# Patient Record
Sex: Male | Born: 1935
Health system: Southern US, Community
[De-identification: ages and names within clinical notes are randomized; demographics above are authoritative.]

## PROBLEM LIST (undated history)

## (undated) DIAGNOSIS — M1990 Unspecified osteoarthritis, unspecified site: Secondary | ICD-10-CM

## (undated) DIAGNOSIS — I219 Acute myocardial infarction, unspecified: Secondary | ICD-10-CM

## (undated) DIAGNOSIS — I2510 Atherosclerotic heart disease of native coronary artery without angina pectoris: Secondary | ICD-10-CM

## (undated) DIAGNOSIS — T8859XA Other complications of anesthesia, initial encounter: Secondary | ICD-10-CM

## (undated) DIAGNOSIS — K219 Gastro-esophageal reflux disease without esophagitis: Secondary | ICD-10-CM

## (undated) DIAGNOSIS — I4891 Unspecified atrial fibrillation: Secondary | ICD-10-CM

## (undated) DIAGNOSIS — I639 Cerebral infarction, unspecified: Secondary | ICD-10-CM

## (undated) DIAGNOSIS — I10 Essential (primary) hypertension: Secondary | ICD-10-CM

## (undated) DIAGNOSIS — I509 Heart failure, unspecified: Secondary | ICD-10-CM

## (undated) DIAGNOSIS — Z9289 Personal history of other medical treatment: Secondary | ICD-10-CM

## (undated) DIAGNOSIS — T4145XA Adverse effect of unspecified anesthetic, initial encounter: Secondary | ICD-10-CM

## (undated) DIAGNOSIS — K922 Gastrointestinal hemorrhage, unspecified: Secondary | ICD-10-CM

## (undated) DIAGNOSIS — Z950 Presence of cardiac pacemaker: Secondary | ICD-10-CM

## (undated) DIAGNOSIS — F0150 Vascular dementia without behavioral disturbance: Secondary | ICD-10-CM

## (undated) HISTORY — PX: CARDIAC SURGERY: SHX584

## (undated) HISTORY — PX: TOTAL SHOULDER ARTHROPLASTY: SHX126

## (undated) HISTORY — PX: CORONARY ARTERY BYPASS GRAFT: SHX141

## (undated) HISTORY — PX: TOTAL HIP ARTHROPLASTY: SHX124

## (undated) HISTORY — PX: INSERT / REPLACE / REMOVE PACEMAKER: SUR710

## (undated) HISTORY — PX: HERNIA REPAIR: SHX51

---

## 1977-09-27 DIAGNOSIS — I219 Acute myocardial infarction, unspecified: Secondary | ICD-10-CM

## 1977-09-27 HISTORY — DX: Acute myocardial infarction, unspecified: I21.9

## 2014-10-14 DIAGNOSIS — M79675 Pain in left toe(s): Secondary | ICD-10-CM | POA: Diagnosis not present

## 2014-10-14 DIAGNOSIS — B351 Tinea unguium: Secondary | ICD-10-CM | POA: Diagnosis not present

## 2014-10-14 DIAGNOSIS — M79674 Pain in right toe(s): Secondary | ICD-10-CM | POA: Diagnosis not present

## 2014-10-21 DIAGNOSIS — I2510 Atherosclerotic heart disease of native coronary artery without angina pectoris: Secondary | ICD-10-CM | POA: Diagnosis not present

## 2014-10-21 DIAGNOSIS — I739 Peripheral vascular disease, unspecified: Secondary | ICD-10-CM | POA: Diagnosis not present

## 2014-10-21 DIAGNOSIS — I4891 Unspecified atrial fibrillation: Secondary | ICD-10-CM | POA: Diagnosis not present

## 2014-10-21 DIAGNOSIS — E785 Hyperlipidemia, unspecified: Secondary | ICD-10-CM | POA: Diagnosis not present

## 2014-10-21 DIAGNOSIS — I10 Essential (primary) hypertension: Secondary | ICD-10-CM | POA: Diagnosis not present

## 2014-10-21 DIAGNOSIS — Z9181 History of falling: Secondary | ICD-10-CM | POA: Diagnosis not present

## 2014-10-21 DIAGNOSIS — I209 Angina pectoris, unspecified: Secondary | ICD-10-CM | POA: Diagnosis not present

## 2014-11-14 DIAGNOSIS — J90 Pleural effusion, not elsewhere classified: Secondary | ICD-10-CM | POA: Diagnosis not present

## 2014-11-14 DIAGNOSIS — R079 Chest pain, unspecified: Secondary | ICD-10-CM | POA: Diagnosis not present

## 2014-11-14 DIAGNOSIS — I10 Essential (primary) hypertension: Secondary | ICD-10-CM | POA: Diagnosis not present

## 2014-11-14 DIAGNOSIS — I2510 Atherosclerotic heart disease of native coronary artery without angina pectoris: Secondary | ICD-10-CM | POA: Diagnosis not present

## 2014-11-14 DIAGNOSIS — I517 Cardiomegaly: Secondary | ICD-10-CM | POA: Diagnosis not present

## 2014-11-14 DIAGNOSIS — I4891 Unspecified atrial fibrillation: Secondary | ICD-10-CM | POA: Diagnosis not present

## 2014-11-14 DIAGNOSIS — R0602 Shortness of breath: Secondary | ICD-10-CM | POA: Diagnosis not present

## 2014-11-14 DIAGNOSIS — K219 Gastro-esophageal reflux disease without esophagitis: Secondary | ICD-10-CM | POA: Diagnosis not present

## 2014-11-26 DIAGNOSIS — I10 Essential (primary) hypertension: Secondary | ICD-10-CM | POA: Diagnosis not present

## 2014-11-26 DIAGNOSIS — I2510 Atherosclerotic heart disease of native coronary artery without angina pectoris: Secondary | ICD-10-CM | POA: Diagnosis not present

## 2014-11-26 DIAGNOSIS — E785 Hyperlipidemia, unspecified: Secondary | ICD-10-CM | POA: Diagnosis not present

## 2014-11-26 DIAGNOSIS — I4891 Unspecified atrial fibrillation: Secondary | ICD-10-CM | POA: Diagnosis not present

## 2014-11-27 DIAGNOSIS — R4182 Altered mental status, unspecified: Secondary | ICD-10-CM | POA: Diagnosis not present

## 2014-11-27 DIAGNOSIS — S0990XA Unspecified injury of head, initial encounter: Secondary | ICD-10-CM | POA: Diagnosis not present

## 2014-11-27 DIAGNOSIS — I6782 Cerebral ischemia: Secondary | ICD-10-CM | POA: Diagnosis not present

## 2014-12-10 ENCOUNTER — Encounter: Payer: Self-pay | Admitting: Cardiology

## 2014-12-10 DIAGNOSIS — I495 Sick sinus syndrome: Secondary | ICD-10-CM | POA: Diagnosis not present

## 2015-01-22 DIAGNOSIS — R2681 Unsteadiness on feet: Secondary | ICD-10-CM | POA: Diagnosis not present

## 2015-01-22 DIAGNOSIS — R296 Repeated falls: Secondary | ICD-10-CM | POA: Diagnosis not present

## 2015-01-22 DIAGNOSIS — G40209 Localization-related (focal) (partial) symptomatic epilepsy and epileptic syndromes with complex partial seizures, not intractable, without status epilepticus: Secondary | ICD-10-CM | POA: Diagnosis not present

## 2015-01-22 DIAGNOSIS — R413 Other amnesia: Secondary | ICD-10-CM | POA: Diagnosis not present

## 2015-01-27 DIAGNOSIS — M79674 Pain in right toe(s): Secondary | ICD-10-CM | POA: Diagnosis not present

## 2015-01-27 DIAGNOSIS — B351 Tinea unguium: Secondary | ICD-10-CM | POA: Diagnosis not present

## 2015-01-27 DIAGNOSIS — M79675 Pain in left toe(s): Secondary | ICD-10-CM | POA: Diagnosis not present

## 2015-01-29 DIAGNOSIS — I2510 Atherosclerotic heart disease of native coronary artery without angina pectoris: Secondary | ICD-10-CM | POA: Diagnosis not present

## 2015-01-29 DIAGNOSIS — E785 Hyperlipidemia, unspecified: Secondary | ICD-10-CM | POA: Diagnosis not present

## 2015-01-29 DIAGNOSIS — I209 Angina pectoris, unspecified: Secondary | ICD-10-CM | POA: Diagnosis not present

## 2015-01-29 DIAGNOSIS — R296 Repeated falls: Secondary | ICD-10-CM | POA: Diagnosis not present

## 2015-01-29 DIAGNOSIS — I739 Peripheral vascular disease, unspecified: Secondary | ICD-10-CM | POA: Diagnosis not present

## 2015-01-29 DIAGNOSIS — I4891 Unspecified atrial fibrillation: Secondary | ICD-10-CM | POA: Diagnosis not present

## 2015-02-03 DIAGNOSIS — G40209 Localization-related (focal) (partial) symptomatic epilepsy and epileptic syndromes with complex partial seizures, not intractable, without status epilepticus: Secondary | ICD-10-CM | POA: Diagnosis not present

## 2015-02-04 DIAGNOSIS — R413 Other amnesia: Secondary | ICD-10-CM | POA: Diagnosis not present

## 2015-02-04 DIAGNOSIS — R410 Disorientation, unspecified: Secondary | ICD-10-CM | POA: Diagnosis not present

## 2015-02-05 DIAGNOSIS — R531 Weakness: Secondary | ICD-10-CM | POA: Diagnosis not present

## 2015-02-05 DIAGNOSIS — R9082 White matter disease, unspecified: Secondary | ICD-10-CM | POA: Diagnosis not present

## 2015-02-05 DIAGNOSIS — R296 Repeated falls: Secondary | ICD-10-CM | POA: Diagnosis not present

## 2015-02-05 DIAGNOSIS — S0990XA Unspecified injury of head, initial encounter: Secondary | ICD-10-CM | POA: Diagnosis not present

## 2015-02-05 DIAGNOSIS — R413 Other amnesia: Secondary | ICD-10-CM | POA: Diagnosis not present

## 2015-02-05 DIAGNOSIS — G319 Degenerative disease of nervous system, unspecified: Secondary | ICD-10-CM | POA: Diagnosis not present

## 2015-02-10 DIAGNOSIS — I4891 Unspecified atrial fibrillation: Secondary | ICD-10-CM | POA: Diagnosis not present

## 2015-02-10 DIAGNOSIS — F0150 Vascular dementia without behavioral disturbance: Secondary | ICD-10-CM | POA: Diagnosis not present

## 2015-02-10 DIAGNOSIS — E784 Other hyperlipidemia: Secondary | ICD-10-CM | POA: Diagnosis not present

## 2015-02-10 DIAGNOSIS — I2510 Atherosclerotic heart disease of native coronary artery without angina pectoris: Secondary | ICD-10-CM | POA: Diagnosis not present

## 2015-02-14 DIAGNOSIS — S8001XA Contusion of right knee, initial encounter: Secondary | ICD-10-CM | POA: Diagnosis not present

## 2015-02-14 DIAGNOSIS — S0181XA Laceration without foreign body of other part of head, initial encounter: Secondary | ICD-10-CM | POA: Diagnosis not present

## 2015-02-14 DIAGNOSIS — J0120 Acute ethmoidal sinusitis, unspecified: Secondary | ICD-10-CM | POA: Diagnosis not present

## 2015-02-14 DIAGNOSIS — M25561 Pain in right knee: Secondary | ICD-10-CM | POA: Diagnosis not present

## 2015-02-14 DIAGNOSIS — J0100 Acute maxillary sinusitis, unspecified: Secondary | ICD-10-CM | POA: Diagnosis not present

## 2015-02-14 DIAGNOSIS — S0990XA Unspecified injury of head, initial encounter: Secondary | ICD-10-CM | POA: Diagnosis not present

## 2015-02-14 DIAGNOSIS — S0993XA Unspecified injury of face, initial encounter: Secondary | ICD-10-CM | POA: Diagnosis not present

## 2015-02-14 DIAGNOSIS — H05231 Hemorrhage of right orbit: Secondary | ICD-10-CM | POA: Diagnosis not present

## 2015-02-14 DIAGNOSIS — T148 Other injury of unspecified body region: Secondary | ICD-10-CM | POA: Diagnosis not present

## 2015-02-14 DIAGNOSIS — Z87891 Personal history of nicotine dependence: Secondary | ICD-10-CM | POA: Diagnosis not present

## 2015-02-14 DIAGNOSIS — Z79899 Other long term (current) drug therapy: Secondary | ICD-10-CM | POA: Diagnosis not present

## 2015-02-14 DIAGNOSIS — S0190XA Unspecified open wound of unspecified part of head, initial encounter: Secondary | ICD-10-CM | POA: Diagnosis not present

## 2015-02-14 DIAGNOSIS — W1830XA Fall on same level, unspecified, initial encounter: Secondary | ICD-10-CM | POA: Diagnosis not present

## 2015-02-14 DIAGNOSIS — Y999 Unspecified external cause status: Secondary | ICD-10-CM | POA: Diagnosis not present

## 2015-02-14 DIAGNOSIS — G501 Atypical facial pain: Secondary | ICD-10-CM | POA: Diagnosis not present

## 2015-02-18 DIAGNOSIS — S01111A Laceration without foreign body of right eyelid and periocular area, initial encounter: Secondary | ICD-10-CM | POA: Diagnosis not present

## 2015-03-04 DIAGNOSIS — H2513 Age-related nuclear cataract, bilateral: Secondary | ICD-10-CM | POA: Diagnosis not present

## 2015-03-10 DIAGNOSIS — I10 Essential (primary) hypertension: Secondary | ICD-10-CM | POA: Diagnosis not present

## 2015-03-10 DIAGNOSIS — M1990 Unspecified osteoarthritis, unspecified site: Secondary | ICD-10-CM | POA: Diagnosis not present

## 2015-03-10 DIAGNOSIS — I2510 Atherosclerotic heart disease of native coronary artery without angina pectoris: Secondary | ICD-10-CM | POA: Diagnosis not present

## 2015-03-10 DIAGNOSIS — M5136 Other intervertebral disc degeneration, lumbar region: Secondary | ICD-10-CM | POA: Diagnosis not present

## 2015-03-10 DIAGNOSIS — K219 Gastro-esophageal reflux disease without esophagitis: Secondary | ICD-10-CM | POA: Diagnosis not present

## 2015-03-10 DIAGNOSIS — I4891 Unspecified atrial fibrillation: Secondary | ICD-10-CM | POA: Diagnosis not present

## 2015-03-10 DIAGNOSIS — M5137 Other intervertebral disc degeneration, lumbosacral region: Secondary | ICD-10-CM | POA: Diagnosis not present

## 2015-04-08 ENCOUNTER — Encounter: Payer: Self-pay | Admitting: Cardiology

## 2015-04-08 DIAGNOSIS — E785 Hyperlipidemia, unspecified: Secondary | ICD-10-CM | POA: Diagnosis not present

## 2015-04-08 DIAGNOSIS — I2510 Atherosclerotic heart disease of native coronary artery without angina pectoris: Secondary | ICD-10-CM | POA: Diagnosis not present

## 2015-04-08 DIAGNOSIS — R413 Other amnesia: Secondary | ICD-10-CM | POA: Diagnosis not present

## 2015-04-08 DIAGNOSIS — I4891 Unspecified atrial fibrillation: Secondary | ICD-10-CM | POA: Diagnosis not present

## 2015-04-08 DIAGNOSIS — G40209 Localization-related (focal) (partial) symptomatic epilepsy and epileptic syndromes with complex partial seizures, not intractable, without status epilepticus: Secondary | ICD-10-CM | POA: Diagnosis not present

## 2015-04-08 DIAGNOSIS — R2681 Unsteadiness on feet: Secondary | ICD-10-CM | POA: Diagnosis not present

## 2015-04-08 DIAGNOSIS — I10 Essential (primary) hypertension: Secondary | ICD-10-CM | POA: Diagnosis not present

## 2015-04-08 DIAGNOSIS — F0150 Vascular dementia without behavioral disturbance: Secondary | ICD-10-CM | POA: Diagnosis not present

## 2015-04-16 ENCOUNTER — Encounter: Payer: Self-pay | Admitting: Cardiology

## 2015-04-16 DIAGNOSIS — I495 Sick sinus syndrome: Secondary | ICD-10-CM | POA: Diagnosis not present

## 2015-04-28 DIAGNOSIS — Z7901 Long term (current) use of anticoagulants: Secondary | ICD-10-CM | POA: Diagnosis not present

## 2015-04-28 DIAGNOSIS — B351 Tinea unguium: Secondary | ICD-10-CM | POA: Diagnosis not present

## 2015-04-28 DIAGNOSIS — M79675 Pain in left toe(s): Secondary | ICD-10-CM | POA: Diagnosis not present

## 2015-04-28 DIAGNOSIS — M79674 Pain in right toe(s): Secondary | ICD-10-CM | POA: Diagnosis not present

## 2015-05-15 DIAGNOSIS — R51 Headache: Secondary | ICD-10-CM | POA: Diagnosis not present

## 2015-05-15 DIAGNOSIS — Z87891 Personal history of nicotine dependence: Secondary | ICD-10-CM | POA: Diagnosis not present

## 2015-05-15 DIAGNOSIS — R109 Unspecified abdominal pain: Secondary | ICD-10-CM | POA: Diagnosis not present

## 2015-05-15 DIAGNOSIS — R110 Nausea: Secondary | ICD-10-CM | POA: Diagnosis not present

## 2015-05-15 DIAGNOSIS — R42 Dizziness and giddiness: Secondary | ICD-10-CM | POA: Diagnosis not present

## 2015-05-15 DIAGNOSIS — I25810 Atherosclerosis of coronary artery bypass graft(s) without angina pectoris: Secondary | ICD-10-CM | POA: Diagnosis not present

## 2015-05-15 DIAGNOSIS — I10 Essential (primary) hypertension: Secondary | ICD-10-CM | POA: Diagnosis not present

## 2015-05-15 DIAGNOSIS — R4781 Slurred speech: Secondary | ICD-10-CM | POA: Diagnosis not present

## 2015-05-19 DIAGNOSIS — Z0000 Encounter for general adult medical examination without abnormal findings: Secondary | ICD-10-CM | POA: Diagnosis not present

## 2015-05-19 DIAGNOSIS — R413 Other amnesia: Secondary | ICD-10-CM | POA: Diagnosis not present

## 2015-05-19 DIAGNOSIS — I4891 Unspecified atrial fibrillation: Secondary | ICD-10-CM | POA: Diagnosis not present

## 2015-05-19 DIAGNOSIS — I10 Essential (primary) hypertension: Secondary | ICD-10-CM | POA: Diagnosis not present

## 2015-05-19 DIAGNOSIS — J018 Other acute sinusitis: Secondary | ICD-10-CM | POA: Diagnosis not present

## 2015-06-17 DIAGNOSIS — F0150 Vascular dementia without behavioral disturbance: Secondary | ICD-10-CM | POA: Diagnosis not present

## 2015-06-17 DIAGNOSIS — I2510 Atherosclerotic heart disease of native coronary artery without angina pectoris: Secondary | ICD-10-CM | POA: Diagnosis not present

## 2015-06-17 DIAGNOSIS — I4891 Unspecified atrial fibrillation: Secondary | ICD-10-CM | POA: Diagnosis not present

## 2015-06-17 DIAGNOSIS — I10 Essential (primary) hypertension: Secondary | ICD-10-CM | POA: Diagnosis not present

## 2015-06-25 DIAGNOSIS — Z23 Encounter for immunization: Secondary | ICD-10-CM | POA: Diagnosis not present

## 2015-07-29 DIAGNOSIS — Z7901 Long term (current) use of anticoagulants: Secondary | ICD-10-CM | POA: Diagnosis not present

## 2015-07-29 DIAGNOSIS — M79675 Pain in left toe(s): Secondary | ICD-10-CM | POA: Diagnosis not present

## 2015-07-29 DIAGNOSIS — B351 Tinea unguium: Secondary | ICD-10-CM | POA: Diagnosis not present

## 2015-07-29 DIAGNOSIS — M79674 Pain in right toe(s): Secondary | ICD-10-CM | POA: Diagnosis not present

## 2015-08-12 DIAGNOSIS — E785 Hyperlipidemia, unspecified: Secondary | ICD-10-CM | POA: Diagnosis not present

## 2015-08-12 DIAGNOSIS — I4891 Unspecified atrial fibrillation: Secondary | ICD-10-CM | POA: Diagnosis not present

## 2015-08-12 DIAGNOSIS — I209 Angina pectoris, unspecified: Secondary | ICD-10-CM | POA: Diagnosis not present

## 2015-08-12 DIAGNOSIS — I2510 Atherosclerotic heart disease of native coronary artery without angina pectoris: Secondary | ICD-10-CM | POA: Diagnosis not present

## 2015-08-12 DIAGNOSIS — R296 Repeated falls: Secondary | ICD-10-CM | POA: Diagnosis not present

## 2015-08-12 DIAGNOSIS — Z950 Presence of cardiac pacemaker: Secondary | ICD-10-CM | POA: Diagnosis not present

## 2015-08-12 DIAGNOSIS — F0390 Unspecified dementia without behavioral disturbance: Secondary | ICD-10-CM | POA: Diagnosis not present

## 2015-08-12 DIAGNOSIS — I739 Peripheral vascular disease, unspecified: Secondary | ICD-10-CM | POA: Diagnosis not present

## 2015-09-01 ENCOUNTER — Encounter: Payer: Self-pay | Admitting: Cardiology

## 2015-09-01 DIAGNOSIS — I495 Sick sinus syndrome: Secondary | ICD-10-CM | POA: Diagnosis not present

## 2015-09-02 DIAGNOSIS — Z111 Encounter for screening for respiratory tuberculosis: Secondary | ICD-10-CM | POA: Diagnosis not present

## 2015-09-02 DIAGNOSIS — I4891 Unspecified atrial fibrillation: Secondary | ICD-10-CM | POA: Diagnosis not present

## 2015-09-02 DIAGNOSIS — K219 Gastro-esophageal reflux disease without esophagitis: Secondary | ICD-10-CM | POA: Diagnosis not present

## 2015-09-02 DIAGNOSIS — I2510 Atherosclerotic heart disease of native coronary artery without angina pectoris: Secondary | ICD-10-CM | POA: Diagnosis not present

## 2015-09-02 DIAGNOSIS — I10 Essential (primary) hypertension: Secondary | ICD-10-CM | POA: Diagnosis not present

## 2015-10-06 DIAGNOSIS — Z111 Encounter for screening for respiratory tuberculosis: Secondary | ICD-10-CM | POA: Diagnosis not present

## 2015-10-06 DIAGNOSIS — Z23 Encounter for immunization: Secondary | ICD-10-CM | POA: Diagnosis not present

## 2015-10-09 DIAGNOSIS — Z79899 Other long term (current) drug therapy: Secondary | ICD-10-CM | POA: Diagnosis not present

## 2015-10-09 DIAGNOSIS — I2510 Atherosclerotic heart disease of native coronary artery without angina pectoris: Secondary | ICD-10-CM | POA: Diagnosis not present

## 2015-10-09 DIAGNOSIS — I10 Essential (primary) hypertension: Secondary | ICD-10-CM | POA: Diagnosis not present

## 2015-10-09 DIAGNOSIS — R0789 Other chest pain: Secondary | ICD-10-CM | POA: Diagnosis not present

## 2015-11-18 ENCOUNTER — Encounter: Payer: Self-pay | Admitting: Cardiology

## 2015-11-18 DIAGNOSIS — I495 Sick sinus syndrome: Secondary | ICD-10-CM | POA: Diagnosis not present

## 2015-11-20 DIAGNOSIS — I10 Essential (primary) hypertension: Secondary | ICD-10-CM | POA: Diagnosis not present

## 2015-11-20 DIAGNOSIS — R0989 Other specified symptoms and signs involving the circulatory and respiratory systems: Secondary | ICD-10-CM | POA: Diagnosis not present

## 2015-11-20 DIAGNOSIS — I4891 Unspecified atrial fibrillation: Secondary | ICD-10-CM | POA: Diagnosis not present

## 2015-11-20 DIAGNOSIS — R0602 Shortness of breath: Secondary | ICD-10-CM | POA: Diagnosis not present

## 2015-11-20 DIAGNOSIS — I517 Cardiomegaly: Secondary | ICD-10-CM | POA: Diagnosis not present

## 2015-11-20 DIAGNOSIS — J90 Pleural effusion, not elsewhere classified: Secondary | ICD-10-CM | POA: Diagnosis not present

## 2015-11-27 DIAGNOSIS — I10 Essential (primary) hypertension: Secondary | ICD-10-CM | POA: Diagnosis not present

## 2015-11-27 DIAGNOSIS — J90 Pleural effusion, not elsewhere classified: Secondary | ICD-10-CM | POA: Diagnosis not present

## 2015-11-27 DIAGNOSIS — I517 Cardiomegaly: Secondary | ICD-10-CM | POA: Diagnosis not present

## 2015-11-27 DIAGNOSIS — R0602 Shortness of breath: Secondary | ICD-10-CM | POA: Diagnosis not present

## 2015-11-27 DIAGNOSIS — Z950 Presence of cardiac pacemaker: Secondary | ICD-10-CM | POA: Diagnosis not present

## 2015-12-03 DIAGNOSIS — I4891 Unspecified atrial fibrillation: Secondary | ICD-10-CM | POA: Diagnosis not present

## 2015-12-03 DIAGNOSIS — I10 Essential (primary) hypertension: Secondary | ICD-10-CM | POA: Diagnosis not present

## 2015-12-03 DIAGNOSIS — I509 Heart failure, unspecified: Secondary | ICD-10-CM | POA: Diagnosis not present

## 2015-12-03 DIAGNOSIS — R0602 Shortness of breath: Secondary | ICD-10-CM | POA: Diagnosis not present

## 2015-12-23 DIAGNOSIS — R399 Unspecified symptoms and signs involving the genitourinary system: Secondary | ICD-10-CM | POA: Diagnosis not present

## 2015-12-31 DIAGNOSIS — I509 Heart failure, unspecified: Secondary | ICD-10-CM | POA: Diagnosis not present

## 2015-12-31 DIAGNOSIS — F0390 Unspecified dementia without behavioral disturbance: Secondary | ICD-10-CM | POA: Diagnosis not present

## 2015-12-31 DIAGNOSIS — I10 Essential (primary) hypertension: Secondary | ICD-10-CM | POA: Diagnosis not present

## 2015-12-31 DIAGNOSIS — I4891 Unspecified atrial fibrillation: Secondary | ICD-10-CM | POA: Diagnosis not present

## 2016-01-26 DIAGNOSIS — K922 Gastrointestinal hemorrhage, unspecified: Secondary | ICD-10-CM

## 2016-01-26 HISTORY — DX: Gastrointestinal hemorrhage, unspecified: K92.2

## 2016-01-29 ENCOUNTER — Emergency Department (HOSPITAL_COMMUNITY): Payer: Medicare Other

## 2016-01-29 ENCOUNTER — Encounter (HOSPITAL_COMMUNITY): Payer: Self-pay | Admitting: Emergency Medicine

## 2016-01-29 ENCOUNTER — Inpatient Hospital Stay (HOSPITAL_COMMUNITY)
Admission: EM | Admit: 2016-01-29 | Discharge: 2016-01-30 | DRG: 378 | Disposition: A | Discharge status: Home Health | Payer: Medicare Other | Attending: Internal Medicine | Admitting: Internal Medicine

## 2016-01-29 DIAGNOSIS — Z951 Presence of aortocoronary bypass graft: Secondary | ICD-10-CM | POA: Diagnosis not present

## 2016-01-29 DIAGNOSIS — F0150 Vascular dementia without behavioral disturbance: Secondary | ICD-10-CM | POA: Diagnosis present

## 2016-01-29 DIAGNOSIS — N179 Acute kidney failure, unspecified: Secondary | ICD-10-CM

## 2016-01-29 DIAGNOSIS — Z515 Encounter for palliative care: Secondary | ICD-10-CM | POA: Diagnosis present

## 2016-01-29 DIAGNOSIS — T82837A Hemorrhage of cardiac prosthetic devices, implants and grafts, initial encounter: Secondary | ICD-10-CM | POA: Diagnosis not present

## 2016-01-29 DIAGNOSIS — Z66 Do not resuscitate: Secondary | ICD-10-CM | POA: Diagnosis present

## 2016-01-29 DIAGNOSIS — I25118 Atherosclerotic heart disease of native coronary artery with other forms of angina pectoris: Secondary | ICD-10-CM | POA: Diagnosis present

## 2016-01-29 DIAGNOSIS — R0600 Dyspnea, unspecified: Secondary | ICD-10-CM

## 2016-01-29 DIAGNOSIS — Z7901 Long term (current) use of anticoagulants: Secondary | ICD-10-CM

## 2016-01-29 DIAGNOSIS — I4891 Unspecified atrial fibrillation: Secondary | ICD-10-CM | POA: Diagnosis present

## 2016-01-29 DIAGNOSIS — I959 Hypotension, unspecified: Secondary | ICD-10-CM | POA: Diagnosis present

## 2016-01-29 DIAGNOSIS — I509 Heart failure, unspecified: Secondary | ICD-10-CM | POA: Diagnosis present

## 2016-01-29 DIAGNOSIS — K921 Melena: Principal | ICD-10-CM | POA: Diagnosis present

## 2016-01-29 DIAGNOSIS — Z96642 Presence of left artificial hip joint: Secondary | ICD-10-CM | POA: Diagnosis present

## 2016-01-29 DIAGNOSIS — K219 Gastro-esophageal reflux disease without esophagitis: Secondary | ICD-10-CM | POA: Diagnosis present

## 2016-01-29 DIAGNOSIS — D500 Iron deficiency anemia secondary to blood loss (chronic): Secondary | ICD-10-CM | POA: Diagnosis present

## 2016-01-29 DIAGNOSIS — K922 Gastrointestinal hemorrhage, unspecified: Secondary | ICD-10-CM | POA: Diagnosis not present

## 2016-01-29 DIAGNOSIS — R0902 Hypoxemia: Secondary | ICD-10-CM | POA: Diagnosis present

## 2016-01-29 DIAGNOSIS — E872 Acidosis: Secondary | ICD-10-CM | POA: Diagnosis present

## 2016-01-29 DIAGNOSIS — I10 Essential (primary) hypertension: Secondary | ICD-10-CM

## 2016-01-29 DIAGNOSIS — R0602 Shortness of breath: Secondary | ICD-10-CM | POA: Diagnosis not present

## 2016-01-29 DIAGNOSIS — M542 Cervicalgia: Secondary | ICD-10-CM | POA: Diagnosis not present

## 2016-01-29 DIAGNOSIS — R51 Headache: Secondary | ICD-10-CM | POA: Diagnosis not present

## 2016-01-29 DIAGNOSIS — I2510 Atherosclerotic heart disease of native coronary artery without angina pectoris: Secondary | ICD-10-CM | POA: Insufficient documentation

## 2016-01-29 DIAGNOSIS — I248 Other forms of acute ischemic heart disease: Secondary | ICD-10-CM | POA: Diagnosis present

## 2016-01-29 DIAGNOSIS — Z950 Presence of cardiac pacemaker: Secondary | ICD-10-CM | POA: Diagnosis not present

## 2016-01-29 DIAGNOSIS — S0990XA Unspecified injury of head, initial encounter: Secondary | ICD-10-CM | POA: Diagnosis not present

## 2016-01-29 DIAGNOSIS — T148XXA Other injury of unspecified body region, initial encounter: Secondary | ICD-10-CM | POA: Insufficient documentation

## 2016-01-29 DIAGNOSIS — W19XXXA Unspecified fall, initial encounter: Secondary | ICD-10-CM

## 2016-01-29 DIAGNOSIS — Z87891 Personal history of nicotine dependence: Secondary | ICD-10-CM

## 2016-01-29 DIAGNOSIS — I110 Hypertensive heart disease with heart failure: Secondary | ICD-10-CM | POA: Diagnosis present

## 2016-01-29 DIAGNOSIS — S199XXA Unspecified injury of neck, initial encounter: Secondary | ICD-10-CM | POA: Diagnosis not present

## 2016-01-29 DIAGNOSIS — M546 Pain in thoracic spine: Secondary | ICD-10-CM | POA: Diagnosis not present

## 2016-01-29 DIAGNOSIS — T148 Other injury of unspecified body region: Secondary | ICD-10-CM | POA: Diagnosis not present

## 2016-01-29 DIAGNOSIS — D649 Anemia, unspecified: Secondary | ICD-10-CM

## 2016-01-29 DIAGNOSIS — D689 Coagulation defect, unspecified: Secondary | ICD-10-CM | POA: Diagnosis present

## 2016-01-29 DIAGNOSIS — E8729 Other acidosis: Secondary | ICD-10-CM

## 2016-01-29 HISTORY — DX: Acute myocardial infarction, unspecified: I21.9

## 2016-01-29 HISTORY — DX: Presence of cardiac pacemaker: Z95.0

## 2016-01-29 HISTORY — DX: Essential (primary) hypertension: I10

## 2016-01-29 HISTORY — DX: Gastro-esophageal reflux disease without esophagitis: K21.9

## 2016-01-29 HISTORY — DX: Heart failure, unspecified: I50.9

## 2016-01-29 HISTORY — DX: Atherosclerotic heart disease of native coronary artery without angina pectoris: I25.10

## 2016-01-29 HISTORY — DX: Other complications of anesthesia, initial encounter: T88.59XA

## 2016-01-29 HISTORY — DX: Adverse effect of unspecified anesthetic, initial encounter: T41.45XA

## 2016-01-29 HISTORY — DX: Unspecified osteoarthritis, unspecified site: M19.90

## 2016-01-29 HISTORY — DX: Unspecified atrial fibrillation: I48.91

## 2016-01-29 HISTORY — DX: Gastrointestinal hemorrhage, unspecified: K92.2

## 2016-01-29 HISTORY — DX: Vascular dementia, unspecified severity, without behavioral disturbance, psychotic disturbance, mood disturbance, and anxiety: F01.50

## 2016-01-29 LAB — CBC
HCT: 22 % — ABNORMAL LOW (ref 39.0–52.0)
HEMOGLOBIN: 7 g/dL — AB (ref 13.0–17.0)
MCH: 26.6 pg (ref 26.0–34.0)
MCHC: 31.8 g/dL (ref 30.0–36.0)
MCV: 83.7 fL (ref 78.0–100.0)
PLATELETS: 240 10*3/uL (ref 150–400)
RBC: 2.63 MIL/uL — AB (ref 4.22–5.81)
RDW: 15.7 % — ABNORMAL HIGH (ref 11.5–15.5)
WBC: 12.6 10*3/uL — AB (ref 4.0–10.5)

## 2016-01-29 LAB — CBC WITH DIFFERENTIAL/PLATELET
Basophils Absolute: 0 10*3/uL (ref 0.0–0.1)
Basophils Relative: 0 %
EOS ABS: 0 10*3/uL (ref 0.0–0.7)
EOS PCT: 0 %
HCT: 15.1 % — ABNORMAL LOW (ref 39.0–52.0)
Hemoglobin: 4.6 g/dL — CL (ref 13.0–17.0)
LYMPHS ABS: 1 10*3/uL (ref 0.7–4.0)
Lymphocytes Relative: 8 %
MCH: 25.3 pg — AB (ref 26.0–34.0)
MCHC: 30.5 g/dL (ref 30.0–36.0)
MCV: 83 fL (ref 78.0–100.0)
MONO ABS: 0.7 10*3/uL (ref 0.1–1.0)
MONOS PCT: 6 %
Neutro Abs: 10 10*3/uL — ABNORMAL HIGH (ref 1.7–7.7)
Neutrophils Relative %: 85 %
Platelets: 266 10*3/uL (ref 150–400)
RBC: 1.82 MIL/uL — AB (ref 4.22–5.81)
RDW: 16.6 % — AB (ref 11.5–15.5)
WBC: 11.8 10*3/uL — AB (ref 4.0–10.5)

## 2016-01-29 LAB — I-STAT CHEM 8, ED
BUN: 43 mg/dL — AB (ref 6–20)
CREATININE: 2 mg/dL — AB (ref 0.61–1.24)
Calcium, Ion: 1.12 mmol/L — ABNORMAL LOW (ref 1.13–1.30)
Chloride: 98 mmol/L — ABNORMAL LOW (ref 101–111)
Glucose, Bld: 107 mg/dL — ABNORMAL HIGH (ref 65–99)
HCT: 16 % — ABNORMAL LOW (ref 39.0–52.0)
HEMOGLOBIN: 5.4 g/dL — AB (ref 13.0–17.0)
POTASSIUM: 3.6 mmol/L (ref 3.5–5.1)
Sodium: 135 mmol/L (ref 135–145)
TCO2: 23 mmol/L (ref 0–100)

## 2016-01-29 LAB — COMPREHENSIVE METABOLIC PANEL
ALBUMIN: 2.5 g/dL — AB (ref 3.5–5.0)
ALT: 18 U/L (ref 17–63)
AST: 32 U/L (ref 15–41)
Alkaline Phosphatase: 41 U/L (ref 38–126)
Anion gap: 11 (ref 5–15)
BUN: 43 mg/dL — AB (ref 6–20)
CHLORIDE: 100 mmol/L — AB (ref 101–111)
CO2: 23 mmol/L (ref 22–32)
CREATININE: 1.95 mg/dL — AB (ref 0.61–1.24)
Calcium: 8 mg/dL — ABNORMAL LOW (ref 8.9–10.3)
GFR calc Af Amer: 35 mL/min — ABNORMAL LOW (ref >60)
GFR, EST NON AFRICAN AMERICAN: 30 mL/min — AB (ref >60)
GLUCOSE: 110 mg/dL — AB (ref 65–99)
Potassium: 3.7 mmol/L (ref 3.5–5.1)
Sodium: 134 mmol/L — ABNORMAL LOW (ref 135–145)
Total Bilirubin: 0.5 mg/dL (ref 0.3–1.2)
Total Protein: 5.8 g/dL — ABNORMAL LOW (ref 6.5–8.1)

## 2016-01-29 LAB — AMMONIA: AMMONIA: 19 umol/L (ref 9–35)

## 2016-01-29 LAB — PREPARE RBC (CROSSMATCH)

## 2016-01-29 LAB — I-STAT CG4 LACTIC ACID, ED: Lactic Acid, Venous: 1.81 mmol/L (ref 0.5–2.0)

## 2016-01-29 LAB — ABO/RH: ABO/RH(D): A POS

## 2016-01-29 LAB — PROTIME-INR
INR: 4.05 — AB (ref 0.00–1.49)
PROTHROMBIN TIME: 38.4 s — AB (ref 11.6–15.2)

## 2016-01-29 LAB — POC OCCULT BLOOD, ED: FECAL OCCULT BLD: POSITIVE — AB

## 2016-01-29 LAB — MAGNESIUM: MAGNESIUM: 2.2 mg/dL (ref 1.7–2.4)

## 2016-01-29 MED ORDER — ONDANSETRON HCL 4 MG/2ML IJ SOLN
4.0000 mg | Freq: Once | INTRAMUSCULAR | Status: AC
  Administered 2016-01-29: 4 mg via INTRAVENOUS
  Filled 2016-01-29: qty 2

## 2016-01-29 MED ORDER — SODIUM CHLORIDE 0.9% FLUSH
3.0000 mL | Freq: Two times a day (BID) | INTRAVENOUS | Status: DC
  Administered 2016-01-29 – 2016-01-30 (×2): 3 mL via INTRAVENOUS

## 2016-01-29 MED ORDER — ACETAMINOPHEN 325 MG PO TABS: 650.0000 mg | ORAL_TABLET | Freq: Four times a day (QID) | ORAL | Status: DC | PRN

## 2016-01-29 MED ORDER — NITROGLYCERIN 0.4 MG SL SUBL: 0.4000 mg | SUBLINGUAL_TABLET | SUBLINGUAL | Status: DC | PRN

## 2016-01-29 MED ORDER — NEBIVOLOL HCL 10 MG PO TABS
10.0000 mg | ORAL_TABLET | Freq: Every day | ORAL | Status: DC
  Filled 2016-01-29: qty 1

## 2016-01-29 MED ORDER — ISOSORBIDE MONONITRATE ER 60 MG PO TB24
120.0000 mg | ORAL_TABLET | Freq: Every day | ORAL | Status: DC
  Administered 2016-01-30: 120 mg via ORAL
  Filled 2016-01-29: qty 2

## 2016-01-29 MED ORDER — POTASSIUM CHLORIDE CRYS ER 20 MEQ PO TBCR
40.0000 meq | EXTENDED_RELEASE_TABLET | Freq: Once | ORAL | Status: AC
  Administered 2016-01-29: 40 meq via ORAL
  Filled 2016-01-29: qty 2

## 2016-01-29 MED ORDER — ACETAMINOPHEN 650 MG RE SUPP: 650.0000 mg | Freq: Four times a day (QID) | RECTAL | Status: DC | PRN

## 2016-01-29 MED ORDER — ROSUVASTATIN CALCIUM 20 MG PO TABS
20.0000 mg | ORAL_TABLET | Freq: Every day | ORAL | Status: DC
Start: 2016-01-29 — End: 2016-01-30
  Administered 2016-01-29 – 2016-01-30 (×2): 20 mg via ORAL
  Filled 2016-01-29 (×2): qty 1

## 2016-01-29 MED ORDER — SODIUM CHLORIDE 0.9 % IV SOLN: Freq: Once | INTRAVENOUS | Status: DC

## 2016-01-29 MED ORDER — SODIUM CHLORIDE 0.9 % IV SOLN
Freq: Once | INTRAVENOUS | Status: AC
  Administered 2016-01-29: 09:00:00 via INTRAVENOUS

## 2016-01-29 MED ORDER — NEBIVOLOL HCL 10 MG PO TABS
10.0000 mg | ORAL_TABLET | Freq: Every day | ORAL | Status: DC
  Administered 2016-01-29 – 2016-01-30 (×2): 10 mg via ORAL
  Filled 2016-01-29: qty 1
  Filled 2016-01-29: qty 4
  Filled 2016-01-29: qty 1

## 2016-01-29 MED ORDER — PANTOPRAZOLE SODIUM 40 MG IV SOLR
80.0000 mg | Freq: Once | INTRAVENOUS | Status: AC
  Administered 2016-01-29: 80 mg via INTRAVENOUS
  Filled 2016-01-29: qty 80

## 2016-01-29 MED ORDER — SODIUM CHLORIDE 0.9 % IV SOLN
8.0000 mg/h | INTRAVENOUS | Status: DC
  Administered 2016-01-29 (×2): 8 mg/h via INTRAVENOUS
  Filled 2016-01-29 (×4): qty 80

## 2016-01-29 MED ORDER — RISPERIDONE 0.25 MG PO TABS
0.2500 mg | ORAL_TABLET | Freq: Every day | ORAL | Status: DC
  Administered 2016-01-29: 0.25 mg via ORAL
  Filled 2016-01-29 (×2): qty 1

## 2016-01-29 MED ORDER — OXYCODONE HCL 5 MG PO TABS
5.0000 mg | ORAL_TABLET | Freq: Four times a day (QID) | ORAL | Status: DC | PRN
  Administered 2016-01-29: 5 mg via ORAL
  Filled 2016-01-29: qty 1

## 2016-01-29 MED ORDER — SODIUM CHLORIDE 0.9 % IV BOLUS (SEPSIS)
1000.0000 mL | Freq: Once | INTRAVENOUS | Status: AC
  Administered 2016-01-29: 1000 mL via INTRAVENOUS

## 2016-01-29 MED ORDER — DOFETILIDE 500 MCG PO CAPS
500.0000 ug | ORAL_CAPSULE | Freq: Two times a day (BID) | ORAL | Status: DC
  Administered 2016-01-29 – 2016-01-30 (×2): 500 ug via ORAL
  Filled 2016-01-29 (×4): qty 1

## 2016-01-29 MED ORDER — SODIUM CHLORIDE 0.9% FLUSH: 3.0000 mL | INTRAVENOUS | Status: DC | PRN

## 2016-01-29 MED ORDER — DONEPEZIL HCL 10 MG PO TABS
10.0000 mg | ORAL_TABLET | Freq: Every day | ORAL | Status: DC
  Administered 2016-01-29: 10 mg via ORAL
  Filled 2016-01-29: qty 1

## 2016-01-29 MED ORDER — SODIUM CHLORIDE 0.9 % IV SOLN: INTRAVENOUS | Status: DC

## 2016-01-29 MED ORDER — DOFETILIDE 500 MCG PO CAPS
500.0000 ug | ORAL_CAPSULE | Freq: Two times a day (BID) | ORAL | Status: DC
  Filled 2016-01-29 (×2): qty 1

## 2016-01-29 MED ORDER — PANTOPRAZOLE SODIUM 40 MG IV SOLR
40.0000 mg | Freq: Two times a day (BID) | INTRAVENOUS | Status: DC
  Administered 2016-01-30: 40 mg via INTRAVENOUS
  Filled 2016-01-29: qty 40

## 2016-01-29 MED ORDER — ESCITALOPRAM OXALATE 10 MG PO TABS
10.0000 mg | ORAL_TABLET | Freq: Every day | ORAL | Status: DC
  Administered 2016-01-30: 10 mg via ORAL
  Filled 2016-01-29: qty 1

## 2016-01-29 NOTE — Consult Note (Signed)
ELECTROPHYSIOLOGY CONSULT NOTE    Patient ID: Gary Leonard MRN: [id number], DOB/AGE: May 26, 1931 82 y.o.  Admit date: 01/29/2016 Date of Consult: 01/29/2016  Primary Physician: No PCP Per Patient Primary Cardiologist: none locally, the patient recently moved from [place] Requesting MD: Dr. Andrey Campanile  Reason for Consultation: Pacemaker pocket hematoma  HPI: Gary Leonard is a 82 y.o. male was brought to [facility] ER by his daughter secondary to progressive weakness and recurrent falls, she had also noted that he had looked pale to her.  The HPI and PMHx is obtained from the daughter at bedside as well as the chart given the patient's advanced dementia.   His PMH includes 3 separate CABG surgeries, chronic CHF, PAFib, PPM, rapidly advancing vascular dementia, HTN.  His daughter tells me that in the last 3 days he has gotten significantly weaker from his baseline, having multiple near falls and at least 3 falls, this time down about 7-8 steps.  No LOC or syncope has been noted.  He has been having episodes of CP that have been resolved with s/l NTG, though at least once required 3 doses.  No overt SOB beyond his baseline has been observed.  In discussion with the daughter, she tells me she has expressed to the primary team that she would like to pursue palliative care at this point and that she has spoken to by her father's primary physician's  Reporting that they agree that this is a very reasonable approach at this point.  The patient tells me he is feeling "OK", denies any significant pain anywhere, says he has some generalized aches and pains, but no specific CP, palpitations or any trouble breathing.  Past Medical History  Diagnosis Date  . Coronary artery disease   . Atrial fibrillation (HCC)   . Chronic CHF (congestive heart failure) (HCC)   . Vascular dementia   . Hypertension      Surgical History:  Past Surgical History  Procedure Laterality Date  . Cardiac surgery      open heart x 3   . Total hip arthroplasty Left   . Hernia repair        (Not in a hospital admission)  Inpatient Medications:  . dofetilide  500 mcg Oral BID  . donepezil  10 mg Oral QHS  . [START ON 01/30/2016] escitalopram  10 mg Oral Daily  . nebivolol  10 mg Oral Daily  . risperiDONE  0.25 mg Oral QHS  . rosuvastatin  20 mg Oral Daily    Allergies: No Known Allergies  Social History   Social History  . Marital Status: Widowed    Spouse Name: N/A  . Number of Children: N/A  . Years of Education: N/A   Occupational History  . Not on file.   Social History Main Topics  . Smoking status: Former Smoker -- 40 years    Types: Cigarettes    Quit date: 09/27/1972  . Smokeless tobacco: Not on file  . Alcohol Use: No  . Drug Use: Not on file  . Sexual Activity: Not on file   Other Topics Concern  . Not on file   Social History Narrative  . No narrative on file     History reviewed. No pertinent family history.   Review of Systems: All other systems reviewed and are otherwise negative except as noted above.  Physical Exam: Filed Vitals:   01/29/16 1046 01/29/16 1115 01/29/16 1130 01/29/16 1333  BP: 111/61 108/61 118/65 110/45  Pulse: 59 60 61  62  Temp:    97.9 F (36.6 C)  TempSrc:    Oral  Resp: SpO2: 100% 100% 99% 96%    GEN- The patient is elderly, appears in NAD, alert and oriented x 1 today.   HEENT: normocephalic, multiple skin tears, abrasions on his head; sclera clear, conjunctiva pink; hearing intact; oropharynx clear; neck supple, no JVP Lymph- no cervical lymphadenopathy Lungs- diminished at the bases bilaterally, normal work of breathing.  No wheezes, rales, rhonchi Heart- Regular rate and rhythm, 2/6 systolic murmur, rubs or gallops, PMI not laterally displaced GI- soft, non-tender, non-distended, bowel sounds present Extremities- no clubbing, cyanosis, or edema; DP/PT/radial pulses 2+ bilaterally MS- no significant deformity or atrophy Skin-  warm and dry, no rash or lesion Psych- euthymic mood, full affect Neuro- no gross deficits observed  Labs:   Lab Results  Component Value Date   WBC 11.8* 01/29/2016   HGB 4.6* 01/29/2016   HCT 15.1* 01/29/2016   MCV 83.0 01/29/2016   PLT 266 01/29/2016    Recent Labs Lab 01/29/16 0649  NA 134*  K 3.7  CL 100*  CO2 23  BUN 43*  CREATININE 1.95*  CALCIUM 8.0*  PROT 5.8*  BILITOT 0.5  ALKPHOS 41  ALT 18  AST 32  GLUCOSE 110*      Radiology/Studies:  Ct Head Wo Contrast 01/29/2016  CLINICAL DATA:  Pain after fall. EXAM: CT HEAD WITHOUT CONTRAST CT CERVICAL SPINE WITHOUT CONTRAST TECHNIQUE: Multidetector CT imaging of the head and cervical spine was performed following the standard protocol without intravenous contrast. Multiplanar CT image reconstructions of the cervical spine were also generated. COMPARISON:  None. FINDINGS: CT HEAD FINDINGS There is mild mucosal thickening in the right maxillary sinus. The paranasal sinuses and mastoid air cells are otherwise normal. The bones are intact. Extracranial soft tissues are normal. There is a tiny sliver of high attenuation over the left parietal region, which is very linear in configuration, consistent with either beam hardening artifact or a tiny amount of calcification. This does not have the appearance of an acute hematoma. No epidural or subarachnoid hemorrhage identified. No mass, mass effect, or midline shift. The ventricles and sulci are mildly prominent, likely due to age related atrophy. Moderate white matter changes are seen. No acute cortical ischemia or infarct. The cerebellum, brainstem, and basal cisterns are normal. CT CERVICAL SPINE FINDINGS There is a right-sided pleural effusion. No traumatic malalignment is identified. Minimal anterolisthesis of C4 versus C5 is likely degenerative and chronic. No fractures are seen. Multilevel degenerative changes are seen. IMPRESSION: 1. No acute intracranial abnormalities. 2.  Degenerative changes in the cervical spine with no traumatic malalignment or fracture. 3. Right pleural effusion. Electronically Signed   By: Gerome Sam III M.D   On: 01/29/2016 08:58   Ct Cervical Spine Wo Contrast 01/29/2016  CLINICAL DATA:  Pain after fall. EXAM: CT HEAD WITHOUT CONTRAST CT CERVICAL SPINE WITHOUT CONTRAST TECHNIQUE: Multidetector CT imaging of the head and cervical spine was performed following the standard protocol without intravenous contrast. Multiplanar CT image reconstructions of the cervical spine were also generated. COMPARISON:  None. FINDINGS: CT HEAD FINDINGS There is mild mucosal thickening in the right maxillary sinus. The paranasal sinuses and mastoid air cells are otherwise normal. The bones are intact. Extracranial soft tissues are normal. There is a tiny sliver of high attenuation over the left parietal region, which is very linear in configuration, consistent with either beam hardening artifact or a  tiny amount of calcification. This does not have the appearance of an acute hematoma. No epidural or subarachnoid hemorrhage identified. No mass, mass effect, or midline shift. The ventricles and sulci are mildly prominent, likely due to age related atrophy. Moderate white matter changes are seen. No acute cortical ischemia or infarct. The cerebellum, brainstem, and basal cisterns are normal. CT CERVICAL SPINE FINDINGS There is a right-sided pleural effusion. No traumatic malalignment is identified. Minimal anterolisthesis of C4 versus C5 is likely degenerative and chronic. No fractures are seen. Multilevel degenerative changes are seen. IMPRESSION: 1. No acute intracranial abnormalities. 2. Degenerative changes in the cervical spine with no traumatic malalignment or fracture. 3. Right pleural effusion. Electronically Signed   By: Gerome Sam III M.D   On: 01/29/2016 08:58   [place] Chest 01/29/2016  CLINICAL DATA:  Hematoma in region of pacemaker, anterior left chest wall  region. Recent fall EXAM: ANTERIOR LEFT CHEST WALL REGION ULTRASOUND COMPARISON:  Chest radiograph Jan 29, 2016 FINDINGS: Longitudinal and transverse images obtained in the anterior left chest wall in the region of soft tissue fullness in the pacemaker insertion site region. There is moderate fluid surrounding the proximal pacemaker lead on the left anteriorly. There is also soft tissue thickening in this area, likely representing a degree of fibrinous tissue as well as surrounding soft tissue muscle. A well-defined abscess is not seen in this area. IMPRESSION: Probable liquefying hematoma in the left anterior chest wall region in the area of palpable fullness. There appears to be potential for breast tissue also surrounding the proximal pacemaker lead. Well-defined mass or abscess not seen in this area. If further evaluation is felt to be warranted, chest CT, ideally with intravenous contrast, could be helpful to further assess. Electronically Signed   By: Bretta Bang III M.D.   On: 01/29/2016 10:40   Dg Chest Port 1 View 01/29/2016  CLINICAL DATA:  Shortness of breath.  Altered mental status. EXAM: PORTABLE CHEST 1 VIEW COMPARISON:  None. FINDINGS: No pneumothorax. Cardiomegaly is identified. The hila and mediastinum are unremarkable. A 2 lead pacemaker is seen. No pulmonary nodules or masses. No focal infiltrates. Haziness over the right lower hemi thorax is likely layering effusion given the effusion seen on the CT of the cervical spine. A PA and lateral chest x-ray could better evaluate. IMPRESSION: Right pleural effusion, better appreciated on the CT of the cervical spine from earlier today. Cardiomegaly. Electronically Signed   By: Gerome Sam III M.D   On: 01/29/2016 10:09    EKG: looks V paced TELEMETRY: is currently A/V paced  DEVICE HISTORY: unknown, implanted "about 2009" per the patient's daughter for afib  Assessment and Plan:  1. PPM pocket hematoma     Non-tender The daughter  states since implant there has been some degree of "puffiness" to it but since his falls is larger     No need for any intervention, monitor     The daughter Gary Leonard obtain device information and once available call for interrogation  2. PAFib     In SR currently     Xarelto/ASA on hold secondary to profound anemia He is on Tikosyn out patient, given he is in SR, discussed with Dr. Elberta Fortis, Jalal Rauch continue current dose, his renal function Gary Leonard need to be monitored daily and dose may require adjustment for renal function Needs daily BMET, Gary Leonard replace K+, check Mag  3. Falls, Profound anemia     GI bleed 4. Coagulopathy 5. Vascular Dementia  Per the daughter rapidly advancing, currently DNR status and states she has discussed with medicine here getting a palliative consult   6. CAD     None today, or here Has  Some degree of stable angina from what it sounds like, though has been having more frequent episodes of CP in the last couple days resolved with s/l NTG, likely exacerbated by his profound anemia with H/H of 4.6/15.1  7. Hx of CHF   No c/o SOB, O2 sats 100% on RA   Unknown EF   Daughter reports has been doing well with current diuretic regime   Monitor closely given PRBC, additional lasix as clinically indicated   Signed, Gary DowseRenee Ursuy, PA-C 01/29/2016 1:42 PM    I have seen and examined this patient with Gary Dowseenee Leonard.  Agree with above, note added to reflect my findings.  On exam, regular rhythm, 2/6 systolic murmur, lungs clear.  Had dual chamber pacemaker placed in 2009.  Has had multiple falls over the last few days.  Currently with hematoma over pacemaker site.  Site nontender currently but with large hematoma.  Would recommend continuing to monitor for hematoma growth but at this time no indication for evacuation.  Currently in sinus rhythm.  Agree with holding anticoagulation at this time due to anemia.  He is on tikosyn and would continue.  Would monitor BMP for K and Cr and  may require dose adjustment.  Kalisi Bevill interrogate device once the daughter finds information on the company.    Jemeka Wagler M. Fabiola Mudgett MD 01/29/2016 8:33 PM

## 2016-01-29 NOTE — Progress Notes (Signed)
Gary Leonard [id number] Admission Data: 01/29/2016 5:38 PM Attending Provider: Earl LagosNischal Narendra, MD  PCP:No PCP Per Patient Consults/ Treatment Team: Treatment Team:  Rounding Lbcardiology, MD  Gary Leonard is a 82 y.o. male patient admitted from ED awake, alert  & orientated  X 4,  DNR, VSS - Blood pressure 118/53, pulse 60, temperature 98.1 F (36.7 C), temperature source Axillary, resp. rate 24, height 5\' 8"  (1.727 m), weight 75.116 kg (165 lb 9.6 oz), SpO2 100 %., room air, no c/o shortness of breath, no c/o chest pain, no distress noted. Tele #21 placed and pt is currently running:paced 60.   IV site WDL: left hand and right AC with a transparent dsg that's clean dry and intact.Left hand running protonix at 6025mL/hour  Allergies:  No Known Allergies   Past Medical History  Diagnosis Date  . Coronary artery disease   . Atrial fibrillation (HCC)   . Chronic CHF (congestive heart failure) (HCC)   . Vascular dementia   . Hypertension     Pt orientation to unit, room and routine. Information packet given to patient/family and safety video watched.  Admission INP armband ID verified with patient/family (daughter), and in place. SR up x 2, fall risk assessment complete with Patient and family (Daughter) verbalizing understanding of risks associated with falls. Pt verbalizes an understanding of how to use the call bell and to call for help before getting out of bed.  Skin: multiple skin abrasions to left side of head, BUE. Bruising noted to right back flank area and bruising noted to pacemaker area to left shoulder.  Pt has redden sacrum and is blanchable. Foam dressing in place. Skin assessment done with Greta RN.  Pt resting in bed, bed is low and locked, bed alarm on lowest setting while family members are in room. Family member stated that she will tell RN when she leaves so that we can put the bed on the middle setting. No further needs voiced at this time. Pt denies pain. Pt states that he  is comfortable at this time. RN will cont to monitor and assist as needed.  Gary Chadracy Shemica Meath, RN 01/29/2016 5:43 PM

## 2016-01-29 NOTE — ED Notes (Signed)
Admitting at bedside 

## 2016-01-29 NOTE — ED Notes (Signed)
Attempted to call report

## 2016-01-29 NOTE — ED Notes (Signed)
Pt presents to ER from home with [facility] for injuries related to a fall down 6-7 carpeted stairs; pt denies LOC and can recall entire event; pt reports being on xarelto; pt arrived to ER on LSB and in [place] with c/o midthoracic back pain; bruising noted to RIGHT middle back, LEFT shoulder, LEFT head abrasion, RIGHT arm skin tear; pt was also noted to have a gross amount of blood in his stool on arrival- unknown when bleeding began

## 2016-01-29 NOTE — ED Notes (Signed)
Regular lunch tray ordered @ 1040

## 2016-01-29 NOTE — ED Notes (Signed)
Patient transported to CT 

## 2016-01-29 NOTE — H&P (Signed)
Date: 01/29/2016               Patient Name:  Gary Leonard MRN: [id number]  DOB: 09/27/1930 Age / Sex: 82 y.o., male   PCP: No Pcp Per Patient         Medical Service: Internal Medicine Teaching Service         Attending Physician: Dr. Gilda Creasehristopher J Pollina, MD    First Contact: Dr. Reubin MilanBilly Kennedy Pager: [phone]  Second Contact: Dr. Griffin BasilJennifer Krall Pager: [phone]       After Hours (After 5p/  First Contact Pager: [phone]  weekends / holidays): Second Contact Pager: [phone]   Chief Complaint: fall  History of Present Illness: Gary Leonard is an 82 year old male with PMH of CAD (s/p CABG in 1979, 1988, 2002), chronic CHF (dx a few months ago), CVA in 1970s, Afib on Xarelto (for many years), has PPM, vascular dementia dx 01/2015 and HTN.   [place] from [place].  Daughter brought him in because of fall around 4AM this AM.  She says for the past 3 days he has complained of increased DOE and CP and has had increased falls (today is 3rd fall in 3 days).  He has not experienced LOC.  Fell down stairs last two falls.  The first was a minor fall down one stair. Today he fell down ~8 carpeted steps and hit head on wall as he was falling.  He has had c/o of central chest pain at rest and with exertion as well as dyspnea in the past three days.  He usually takes about 3 NTG per month but he has required 3 per day in the past two days.  The chest pain does respond to NTG.  He has not had orthopnea, PND.  He sometimes has leg swelling B/L, R> L (right hip surg) .  Daughter reports good outpatient follow-up in [place], [place].  She says his doctors suspect he suffered a silent MI a few months ago and have given him a guarded long-term prognosis of likely months and not years.     Meds: Current Facility-Administered Medications  Medication Dose Route Frequency Provider Last Rate Last Dose  . 0.9 %  sodium chloride infusion   Intravenous Continuous Abigail Harris, PA-C      . 0.9 %  sodium chloride infusion   Intravenous  Once Arthor CaptainAbigail Harris, PA-C      . pantoprazole (PROTONIX) 80 mg in sodium chloride 0.9 % 250 mL (0.32 mg/mL) infusion  8 mg/hr Intravenous Continuous Arthor CaptainAbigail Harris, PA-C 25 mL/hr at 01/29/16 0721 8 mg/hr at 01/29/16 0721   No current outpatient prescriptions on file.    Allergies: Allergies as of 01/29/2016  . (No Known Allergies)   Past Medical History  Diagnosis Date  . Coronary artery disease   . Atrial fibrillation [facility](HCC)    Past Surgical History  Procedure Laterality Date  . Cardiac surgery      open heart x 3  . Total hip arthroplasty Left   . Hernia repair     Family History  Problem Relation Age of Onset  . Heart disease Sister    Social History   Social History  . Marital Status: Widowed    Spouse Name: N/A  . Number of Children: N/A  . Years of Education: N/A   Occupational History  . Not on file.   Social History Main Topics  . Smoking status: Former Smoker -- 40 years    Types:  Cigarettes    Quit date: 09/27/1972  . Smokeless tobacco: Not on file  . Alcohol Use: No  . Drug Use: Not on file  . Sexual Activity: Not on file   Other Topics Concern  . Not on file   Social History Narrative  . No narrative on file   Smoked for 10 years < 1/2 PPD, quit in 1974  Review of Systems: General:  No fever or unexplained weight loss, +fatigue GI:  +Nausea, no vomiting, increased BMs but not loose + gas GU:  No difficulty urinating, hematuria, dysuria  Cardiac:  Per HPI Pulm:  Per HPI Neuro:  Ambulatory, no loss of vision Heme:  + easy bruising on Xarelto  Physical Exam: Blood pressure 125/55, pulse 59, temperature 97.7 F (36.5 C), temperature source Oral, resp. rate 17, SpO2 96 %. General: resting in bed in NAD HEENT: c-collar in place, PERRL, EOMI, no scleral icterus, no bruising beneath eyes; there are two small skin tears on frontal scalp w/o evidence of hematoma, the left ear is bruised and a little swollen, B/L ear canals have small amounts of wax  w/o blood and both TMs are visualized and intact, no battle sign Cardiac: RRR, no rubs or gallops, + 2/6 systolic murmur Pulm: clear to auscultation bilaterally, moving normal volumes of air Abd: soft, nontender, nondistended, BS present Ext: no pedal edema, PT pulses equal B/L, DPs are not palpable, there is no cyanosis Neuro: alert and oriented to person and knows we are in a hospital (when given the choice between "church or hospital") but is unable to tell me city/state/country or year, cranial nerves II-XII grossly intact, MAE x 4, follows commands  Lab results: Basic Metabolic Panel:  Recent Labs  16/10/96 0643 01/29/16 0649  NA 135 134*  K 3.6 3.7  CL 98* 100*  CO2  --  23  GLUCOSE 107* 110*  BUN 43* 43*  CREATININE 2.00* 1.95*  CALCIUM  --  8.0*   Liver Function Tests:  Recent Labs  01/29/16 0649  AST 32  ALT 18  ALKPHOS 41  BILITOT 0.5  PROT 5.8*  ALBUMIN 2.5*    Recent Labs  01/29/16 0648  AMMONIA 19   CBC:  Recent Labs  01/29/16 0643 01/29/16 0649  WBC  --  11.8*  NEUTROABS  --  10.0*  HGB 5.4* 4.6*  HCT 16.0* 15.1*  MCV  --  83.0  PLT  --  266   Cardiac Enzymes: No results for input(s): CKTOTAL, CKMB, CKMBINDEX, TROPONINI in the last 72 hours.  Coagulation:  Recent Labs  01/29/16 0649  LABPROT 38.4*  INR 4.05*    Imaging results:  Ct Head Wo Contrast  01/29/2016  CLINICAL DATA:  Pain after fall. EXAM: CT HEAD WITHOUT CONTRAST CT CERVICAL SPINE WITHOUT CONTRAST TECHNIQUE: Multidetector CT imaging of the head and cervical spine was performed following the standard protocol without intravenous contrast. Multiplanar CT image reconstructions of the cervical spine were also generated. COMPARISON:  None. FINDINGS: CT HEAD FINDINGS There is mild mucosal thickening in the right maxillary sinus. The paranasal sinuses and mastoid air cells are otherwise normal. The bones are intact. Extracranial soft tissues are normal. There is a tiny sliver of  high attenuation over the left parietal region, which is very linear in configuration, consistent with either beam hardening artifact or a tiny amount of calcification. This does not have the appearance of an acute hematoma. No epidural or subarachnoid hemorrhage identified. No mass, mass effect, or midline  shift. The ventricles and sulci are mildly prominent, likely due to age related atrophy. Moderate white matter changes are seen. No acute cortical ischemia or infarct. The cerebellum, brainstem, and basal cisterns are normal. CT CERVICAL SPINE FINDINGS There is a right-sided pleural effusion. No traumatic malalignment is identified. Minimal anterolisthesis of C4 versus C5 is likely degenerative and chronic. No fractures are seen. Multilevel degenerative changes are seen. IMPRESSION: 1. No acute intracranial abnormalities. 2. Degenerative changes in the cervical spine with no traumatic malalignment or fracture. 3. Right pleural effusion. Electronically Signed   By: Gerome Sam III M.D   On: 01/29/2016 08:58   Ct Cervical Spine Wo Contrast  01/29/2016  CLINICAL DATA:  Pain after fall. EXAM: CT HEAD WITHOUT CONTRAST CT CERVICAL SPINE WITHOUT CONTRAST TECHNIQUE: Multidetector CT imaging of the head and cervical spine was performed following the standard protocol without intravenous contrast. Multiplanar CT image reconstructions of the cervical spine were also generated. COMPARISON:  None. FINDINGS: CT HEAD FINDINGS There is mild mucosal thickening in the right maxillary sinus. The paranasal sinuses and mastoid air cells are otherwise normal. The bones are intact. Extracranial soft tissues are normal. There is a tiny sliver of high attenuation over the left parietal region, which is very linear in configuration, consistent with either beam hardening artifact or a tiny amount of calcification. This does not have the appearance of an acute hematoma. No epidural or subarachnoid hemorrhage identified. No mass, mass  effect, or midline shift. The ventricles and sulci are mildly prominent, likely due to age related atrophy. Moderate white matter changes are seen. No acute cortical ischemia or infarct. The cerebellum, brainstem, and basal cisterns are normal. CT CERVICAL SPINE FINDINGS There is a right-sided pleural effusion. No traumatic malalignment is identified. Minimal anterolisthesis of C4 versus C5 is likely degenerative and chronic. No fractures are seen. Multilevel degenerative changes are seen. IMPRESSION: 1. No acute intracranial abnormalities. 2. Degenerative changes in the cervical spine with no traumatic malalignment or fracture. 3. Right pleural effusion. Electronically Signed   By: Gerome Sam III M.D   On: 01/29/2016 08:58   Dg Chest Port 1 View  01/29/2016  CLINICAL DATA:  Shortness of breath.  Altered mental status. EXAM: PORTABLE CHEST 1 VIEW COMPARISON:  None. FINDINGS: No pneumothorax. Cardiomegaly is identified. The hila and mediastinum are unremarkable. A 2 lead pacemaker is seen. No pulmonary nodules or masses. No focal infiltrates. Haziness over the right lower hemi thorax is likely layering effusion given the effusion seen on the CT of the cervical spine. A PA and lateral chest x-ray could better evaluate. IMPRESSION: Right pleural effusion, better appreciated on the CT of the cervical spine from earlier today. Cardiomegaly. Electronically Signed   By: Gerome Sam III M.D   On: 01/29/2016 10:09    Other results: EKG: 60bpm, prolonged QT.  Looks paced but no pacer spikes visualized.  Assessment & Plan by Problem: Mr. Leeson is an 82 year old male with PMH of CAD (s/p CABG in 1979, 1988, 2002), chronic CHF (dx a few months ago), CVA in 1970s, Afib on Xarelto (for many years), has PPM, vascular dementia dx 01/2015 and HTN.   Symptomatic anemia due to GI bleed:  Likely UGIB exacerbated by Xarelto use.  Unclear etiology.  Daughter says the patient has had prior c-scope but no known pathology.   No hx of GI bleed.  Patient with worsening vascular dementia and daughter says he has made his wishes clear in his living will and  would not want aggressive measures.  Daughter is HCPOA.  Daughter reports that the patient's healthcare providers in [place] have told her that he has limited life expectancy due to cardiac problems; it is suspected that he had silent MI in recent months and doctors have told her he likely has months and not years.  Not interested in aggressive care, colonoscopy, surgery.   - admit to medsurg - prepare an additional 2 units PRBCs as I suspect he will require 2 more units after the current two - H & H after first 2 units, and recheck again this PM - IV lasix prn after blood product - IV PPI - hold Xarelto, ASA due to bleeding - no indication for Kcentra currently but consider if bleeding persists or hgb not responsive to PRBCs  AKI (acute kidney injury) (HCC):  Cr 1.95.  Unknown baseline but daughter is not aware of any CKD.  This is likely pre-renal due to severe anemia. - s/p 1L bolus in ED, will hold further fluids given reported HF and he is getting blood products. - PRBCs as above -  hold ramipril given volume loss and AKI  Pacemaker pocket hematoma:  Bruising and swelling noted over pocket since fall this AM.  [place] c/w hematoma. - EP was consulted and recommendations are appreciated - will continue to monitor at this time - daughter will bring in device info so it can be interrogated  Atrial fibrillation:  Currently in NSR.  Home medications are Tikosyn, Bioprolol and Xarelto. - hold Xarelto as above - continue Tikosyn and Bisoprolol  CAD:  Patient has significant cardiac hx and usually takes NTG a few times per month with recent use in past few days.  I suspect demand ischemia due to significant anemia.  He is comfortable, chest pain free in the hospital.  Will hold off on troponin as this will not change management.  Daughter reports doctors in [place] say he is not a  candidate for cath, further interventions.  At this point, his anemia precludes the use of heparin. - PRBCs as above with goal hgb > 8 - continue BB, statin, imdur, prn NTG - hold ACEI as above  Vascular dementia:  Daughter reports hx of vascular dementia dx last year.  He is on Aricept and Risperidone for agitation.  He is able to tell me his name and recognizes his daughter.  He does not know the year.  He needs prompting for place.  Daughter says he has made his wishes clear in his living will that he would not want aggressive care.   - continue Aricept and Risperidone - try to maintain appropriate sleep/wake cycle - re-orient as needed  Hypertension:  Normotensive. - continue Bisoprolol for rate control - hold ACEI as above  Diet:  Regular VTE ppx:  SCDs, no pharmacologic VTE given bleeding Code status:  DNR.  Daughter is HCPOA and reports that patient would not want aggressive care or CPR if he arrested.  She says he has made this clear to his family in the past and in his living will.  She will bring in documents that support this.      Dispo: Disposition is deferred at this time, awaiting improvement of current medical problems. Anticipated discharge in approximately 1-2 day(s).   The patient does have a current PCP in [place], [place] and does not need an [facility] hospital follow-up appointment after discharge.  The patient does not have transportation limitations that hinder transportation to clinic appointments.  Signed:  Yolanda Manges, DO 01/29/2016, 7:56 AM

## 2016-01-29 NOTE — ED Provider Notes (Signed)
CSN: [id number]     Arrival date & time 01/29/16  0550 History   First MD Initiated Contact with Patient 01/29/16 [phone]     Chief Complaint  Patient presents with  . Fall  . GI Bleeding     (Consider location/radiation/quality/duration/timing/severity/associated sxs/prior Treatment) HPI   This is an 82 year old male brought in by his daughter for fall. There is a level V caveat due to dementia. Daughter states that he has recently moved in with them from his home in [place]. He has a past medical history of coronary artery disease, status post pacemaker placement. He has a history of CHF and takes 60 mg of Lasix daily. The patient is on Xarelto for his previous cardiac history. The daughter states that over the past 3 days he has been extremely fatigued and pale. She states that he's been falling without warning and that he cannot walk more than a few steps without becoming extremely shaky and having to sit down. He is also winded in just a few steps. She denies any swelling. Upon arrival, patient was found to have heavy melanotic stool. He has no history of GI bleed. He does take medication for GERD. He has had previous colonoscopies without abnormality. Patient denies abdominal pain. No nausea or vomiting. No hx of liver disease or alcohol abuse.  Past Medical History  Diagnosis Date  . Coronary artery disease   . Atrial fibrillation [facility](HCC)    Past Surgical History  Procedure Laterality Date  . Cardiac surgery      open heart x 3  . Total hip arthroplasty Left   . Hernia repair     History reviewed. No pertinent family history. Social History  Substance Use Topics  . Smoking status: Former Smoker -- 40 years    Types: Cigarettes    Quit date: 09/27/1972  . Smokeless tobacco: None  . Alcohol Use: No    Review of Systems  Unable to review systems due to dementia.  Allergies  Review of patient's allergies indicates no known allergies.  Home Medications   Prior to Admission  medications   Not on File   BP 136/93 mmHg  Pulse 69  Temp(Src) 97.7 F (36.5 C) (Oral)  Resp 18  SpO2 97% Physical Exam  Constitutional: He appears well-developed and well-nourished. No distress.  HENT:  Head: Normocephalic.  Skin tear over the left frontal and parietal region. No active bleeding. No hematoma  Small petechiae on the hard palate, bruising on the left side of the tongue and sublingual area Pale. Oral mucosa  Eyes: Conjunctivae and EOM are normal. Pupils are equal, round, and reactive to light. No scleral icterus.  Mild crusting of the eyes bilaterally Pale conjunctivae  Neck:  C-collar in place  Cardiovascular: Normal rate, regular rhythm and normal heart sounds.   Protruding left pacemaker near the axilla. Large hematoma swelling into the axillary region.  Pulmonary/Chest: Effort normal and breath sounds normal. No respiratory distress.  Abdominal: Soft. He exhibits no distension and no mass. There is no tenderness. There is no guarding.  Genitourinary:  Stool is melanotic  Musculoskeletal: He exhibits no edema.  Neurological: He is alert.  Skin: Skin is warm and dry. He is not diaphoretic.  Multiple bruises and petechiae  Psychiatric: His behavior is normal.  Nursing note and vitals reviewed.   ED Course  .Critical Care Performed by: Arthor CaptainHARRIS, Berkleigh Beckles Authorized by: Arthor CaptainHARRIS, Norvella Loscalzo Critical care time was exclusive of separately billable procedures and treating other patients.   (  including critical care time) Labs Review Labs Reviewed  POC OCCULT BLOOD, ED - Abnormal; Notable for the following:    Fecal Occult Bld POSITIVE (*)    All other components within normal limits  COMPREHENSIVE METABOLIC PANEL  CBC WITH DIFFERENTIAL/PLATELET  AMMONIA  PROTIME-INR  I-STAT CHEM 8, ED  I-STAT CG4 LACTIC ACID, ED  TYPE AND SCREEN    Imaging Review No results found. I have personally reviewed and evaluated these images and lab results as part of my medical  decision-making.   EKG Interpretation None      MDM   Final diagnoses:  Fall  Acute GI bleeding  Melena  Hypoxia  Abrasion  Hematoma  AKI (acute kidney injury) (HCC)  DNR (do not resuscitate)  Increased anion gap metabolic acidosis    6:46 AM BP 136/93 mmHg  Pulse 69  Temp(Src) 97.7 F (36.5 C) (Oral)  Resp 18  SpO2 97%  Patient with active melanotic GI bleed. poct hgb 5.4. Receiving fluids and  Patient is hypotensive with hypoxia between the high 80s and low 90s. Patient placed on 2 L via nasal cannula. He is receiving protonic and fluids.\ Creantinine and BUN are elvated with a anion gap of 19 which I suspect is due to Uremia.   Patient will be admitted for comfort care.  Head and cspine ct are negative. I have spoken with internal medicine who will admit the patient. His family does not want anesthesia or any procedures that require it. He is DNR DNI.   Arthor Captain, PA-C 01/30/16 1191  Gilda Crease, MD 01/30/16 [phone]

## 2016-01-30 DIAGNOSIS — K922 Gastrointestinal hemorrhage, unspecified: Secondary | ICD-10-CM

## 2016-01-30 DIAGNOSIS — T148 Other injury of unspecified body region: Secondary | ICD-10-CM

## 2016-01-30 LAB — CBC
HEMATOCRIT: 24.1 % — AB (ref 39.0–52.0)
Hemoglobin: 7.7 g/dL — ABNORMAL LOW (ref 13.0–17.0)
MCH: 26.6 pg (ref 26.0–34.0)
MCHC: 32 g/dL (ref 30.0–36.0)
MCV: 83.1 fL (ref 78.0–100.0)
Platelets: 214 10*3/uL (ref 150–400)
RBC: 2.9 MIL/uL — ABNORMAL LOW (ref 4.22–5.81)
RDW: 15.4 % (ref 11.5–15.5)
WBC: 10 10*3/uL (ref 4.0–10.5)

## 2016-01-30 LAB — CBC WITH DIFFERENTIAL/PLATELET
BASOS ABS: 0 10*3/uL (ref 0.0–0.1)
BASOS PCT: 0 %
EOS PCT: 1 %
Eosinophils Absolute: 0.1 10*3/uL (ref 0.0–0.7)
HCT: 23.7 % — ABNORMAL LOW (ref 39.0–52.0)
Hemoglobin: 7.7 g/dL — ABNORMAL LOW (ref 13.0–17.0)
LYMPHS PCT: 11 %
Lymphs Abs: 1.1 10*3/uL (ref 0.7–4.0)
MCH: 27 pg (ref 26.0–34.0)
MCHC: 32.5 g/dL (ref 30.0–36.0)
MCV: 83.2 fL (ref 78.0–100.0)
Monocytes Absolute: 0.9 10*3/uL (ref 0.1–1.0)
Monocytes Relative: 9 %
Neutro Abs: 7.8 10*3/uL — ABNORMAL HIGH (ref 1.7–7.7)
Neutrophils Relative %: 79 %
PLATELETS: 216 10*3/uL (ref 150–400)
RBC: 2.85 MIL/uL — AB (ref 4.22–5.81)
RDW: 15.4 % (ref 11.5–15.5)
WBC: 10 10*3/uL (ref 4.0–10.5)

## 2016-01-30 LAB — PREPARE RBC (CROSSMATCH)

## 2016-01-30 LAB — BASIC METABOLIC PANEL
Anion gap: 12 (ref 5–15)
BUN: 36 mg/dL — AB (ref 6–20)
CO2: 20 mmol/L — AB (ref 22–32)
Calcium: 8.1 mg/dL — ABNORMAL LOW (ref 8.9–10.3)
Chloride: 104 mmol/L (ref 101–111)
Creatinine, Ser: 1.71 mg/dL — ABNORMAL HIGH (ref 0.61–1.24)
GFR calc Af Amer: 41 mL/min — ABNORMAL LOW (ref >60)
GFR, EST NON AFRICAN AMERICAN: 35 mL/min — AB (ref >60)
GLUCOSE: 131 mg/dL — AB (ref 65–99)
POTASSIUM: 4.2 mmol/L (ref 3.5–5.1)
Sodium: 136 mmol/L (ref 135–145)

## 2016-01-30 LAB — MAGNESIUM: MAGNESIUM: 2 mg/dL (ref 1.7–2.4)

## 2016-01-30 MED ORDER — SODIUM CHLORIDE 0.9 % IV SOLN
Freq: Once | INTRAVENOUS | Status: AC
  Administered 2016-01-30: 09:00:00 via INTRAVENOUS

## 2016-01-30 MED ORDER — BACITRACIN-NEOMYCIN-POLYMYXIN OINTMENT TUBE
TOPICAL_OINTMENT | Freq: Every day | CUTANEOUS | Status: DC
Start: 2016-01-30 — End: 2016-01-30
  Filled 2016-01-30: qty 15

## 2016-01-30 NOTE — Discharge Instructions (Signed)
Please STOP taking the aspirin and Xarelto for at least the short-term. Take all your other medications as previously prescribed.  Please come to our clinic (Internal Medicine Clinic - Located on the ground floor of the hospital) on Thursday, May 18th for a 10:15am appointment to re-check your blood level. We will consider whether or not to re-start your blood thinners at that time.  Also, the home health services should be contacting you soon. Let us know at the clinic follow-up if they haven't yet.  Thanks, Gary MilanBilly Shenouda [place]

## 2016-01-30 NOTE — Care Management Note (Signed)
Case Management Note  Patient Details  Name: Sharla KidneyMitchell Hyde MRN: [id number] Date of Birth: 07/11/1931  Subjective/Objective:                 Spoke with patient and family at length. Patient willDC to home with son and daughter in law Trey PaulaJeff and Cheryln Manlyina Seltzer. Inetta Fermoina is a NP and assisted in choosing HH. HH RN PT OT SW [facility]. [facility] and delivered to room prior to DC. Patient will also be follwed by Palliative through [facility]. Patient will have F/[facility] with [facility] under Dr Izola PriceMyers, but see PA Lovenia KimMark Hepler with first appointment.    Action/Plan:   Expected Discharge Date:                  Expected Discharge Plan:  Home w Home Health Services  In-House Referral:     Discharge planning Services  CM Consult  Post Acute Care Choice:  Home Health, Durable Medical Equipment Choice offered to:  Patient, Adult Children  DME Arranged:  Wheelchair manual DME Agency:  [facility]  HH Arranged:  RN, PT, OT, Nurse's Aide, Social Work [facility]:  [facility]  Status of Service:  Completed, signed off  Medicare Important Message Given:    Date Medicare IM Given:    Medicare IM give by:    Date Additional Medicare IM Given:    Additional Medicare Important Message give by:     If discussed at Long Length of Stay Meetings, dates discussed:    Additional Comments:  Lawerance SabalDebbie Karrington Studnicka, RN 01/30/2016, 3:11 PM

## 2016-01-30 NOTE — Progress Notes (Signed)
Subjective: Gary Leonard had no acute events overnight. His chest pain and shortness of breath have completely resolved and he wants to go home. He feels well and has been ambulating all morning without difficulty. He denies any recurrent melena or hematochezia, and denies abdominal pain, N/V/D. Speaking with his daughter this morning, he is going to be staying with family in [place] for at least the foreseeable future and would be interested in getting palliative care services at their home.   Objective: Vital signs in last 24 hours: Filed Vitals:   01/30/16 0500 01/30/16 0543 01/30/16 1052 01/30/16 1130  BP:  114/48 123/57 125/61  Pulse:  59 59 59  Temp:  97.7 F (36.5 C) 97.7 F (36.5 C) 97.6 F (36.4 C)  TempSrc:   Oral Oral  Resp:  18 18 18   Height:      Weight: 369 lb 14.9 oz (167.8 kg)     SpO2:  99% 99% 99%   Weight change:   Intake/Output Summary (Last 24 hours) at 01/30/16 1241 Last data filed at 01/30/16 0723  Gross per 24 hour  Intake    910 ml  Output    529 ml  Net    381 ml     Gen: Well-appearing, alert and oriented to person, place, but not time. CV: Normal rate, regular rhythm, grade II/VI blowing systolic murmur heard best at the LSB that is non-radiating, no rubs, or gallops Pulmonary: Normal effort, CTA bilaterally, no crackles or wheezes Abdominal: Soft, non-tender, non-distended, without rebound, guarding, or masses Extremities: Distal pulses 2+ in upper and lower extremities bilaterally, no tenderness, erythema or edema Neuro: CN II-XII grossly intact, no focal weakness or sensory deficits noted Skin: No atypical appearing moles. No rashes  Lab Results: Basic Metabolic Panel:  Recent Labs Lab 01/29/16 0649 01/29/16 1820 01/30/16 0339  NA 134*  --  136  K 3.7  --  4.2  CL 100*  --  104  CO2 23  --  20*  GLUCOSE 110*  --  131*  BUN 43*  --  36*  CREATININE 1.95*  --  1.71*  CALCIUM 8.0*  --  8.1*  MG  --  2.2 2.0   CBC:  Recent  Labs Lab 01/29/16 0649 01/29/16 1820 01/30/16 0339  WBC 11.8* 12.6* 10.0  10.0  NEUTROABS 10.0*  --  7.8*  HGB 4.6* 7.0* 7.7*  7.7*  HCT 15.1* 22.0* 23.7*  24.1*  MCV 83.0 83.7 83.2  83.1  PLT 266 240 216  214   Coagulation:  Recent Labs Lab 01/29/16 0649  LABPROT 38.4*  INR 4.05*   Assessment/Plan: Symptomatic anemia due to GI bleed: Likely UGIB exacerbated by Xarelto use. Unclear etiology. Daughter says the patient has had prior c-scope but no known pathology. No hx of GI bleed. Patient with worsening vascular dementia and daughter says he has made his wishes clear in his living will and would not want aggressive measures. Daughter is HCPOA. Daughter reports that the patient's healthcare providers in [place] have told her that he has limited life expectancy due to cardiac problems; it is suspected that he had silent MI in recent months and doctors have told her he likely has months and not years. Not interested in aggressive care, colonoscopy, surgery. Much improved now s/p 3 units with appropriate improvement in his H&H.  -Giving 1U PRBCs now; will likely d/c after  -Consulted palliative care for possible home hospice/further services -Holding Xarelto and ASA on discharge; re-consider  restarting in future -Re-check H and H in ~2 weeks in our clinic or clinic of their choice  AKI (acute kidney injury) [facility]): Cr 1.95 initially, now resolving. Unknown baseline but daughter is not aware of any CKD. This is likely pre-renal due to severe anemia. - s/p 1L bolus in ED, will hold further fluids given reported HF and he is getting blood products. - PRBCs as above - hold ramipril given volume loss and AKI  Pacemaker pocket hematoma: Bruising and swelling noted over pocket since fall this AM. [place] c/w hematoma. - EP was consulted and recommendations are appreciated - will continue to monitor at this time - daughter will bring in device info so it can be interrogated  Atrial  fibrillation: Currently in NSR. Home medications are Tikosyn, Bioprolol and Xarelto. - hold Xarelto as above - continue Tikosyn and Bisoprolol  Dispo: Disposition is deferred at this time, awaiting improvement of current medical problems.  Anticipated discharge this afternoon, pending palliative care consult.   The patient does have a current PCP (No Pcp Per Patient) and does need an [facility] hospital follow-up appointment after discharge.  The patient does not have transportation limitations that hinder transportation to clinic appointments.  LOS: 1 day   Darrick Huntsman, MD 01/30/2016, 12:41 PM

## 2016-01-30 NOTE — Progress Notes (Signed)
SUBJECTIVE: The patient is doing well today.  At this time, he denies chest pain, shortness of breath, or any new concerns.  . sodium chloride   Intravenous Once  . sodium chloride   Intravenous Once  . dofetilide  500 mcg Oral BID  . donepezil  10 mg Oral QHS  . escitalopram  10 mg Oral Daily  . isosorbide mononitrate  120 mg Oral Daily  . nebivolol  10 mg Oral Daily  . pantoprazole (PROTONIX) IV  40 mg Intravenous Q12H  . risperiDONE  0.25 mg Oral QHS  . rosuvastatin  20 mg Oral Daily  . sodium chloride flush  3 mL Intravenous Q12H      OBJECTIVE: Physical Exam: Filed Vitals:   01/29/16 2110 01/29/16 2345 01/30/16 0500 01/30/16 0543  BP: 122/59 133/60  114/48  Pulse: 59 63  59  Temp: 98 F (36.7 C) 97.5 F (36.4 C)  97.7 F (36.5 C)  TempSrc: Oral Oral    Resp: 22 20  18   Height:      Weight:   369 lb 14.9 oz (167.8 kg)   SpO2: 100% 100%  99%    Intake/Output Summary (Last 24 hours) at 01/30/16 0818 Last data filed at 01/30/16 [id number]  Gross per 24 hour  Intake    670 ml  Output    729 ml  Net    -59 ml    Telemetry reveals AV paced rhythm  GEN- The patient is elderly, in NAD, alert and oriented to self and family   Head- normocephalic, atraumatic Eyes-  Sclera clear, conjunctiva pink Ears- hearing intact Oropharynx- clear Neck- supple, no JVP Lungs- Clear to ausculation bilaterally, normal work of breathing Pacer site hematoma, stable Heart- Regular rate and rhythm, no significant murmurs, no rubs or gallops GI- soft, NT, ND Extremities- no clubbing, cyanosis, or edema Skin- no rash, multiple skin tears on head, ecchymosis/bruises to limbs, L axillla chest. Psych- euthymic mood, full affect, conversational and pleasent Neuro- no gross deficits appreciated  LABS: Basic Metabolic Panel:  Recent Labs  96/12/5403/04/17 0649 01/29/16 1820 01/30/16 0339  NA 134*  --  136  K 3.7  --  4.2  CL 100*  --  104  CO2 23  --  20*  GLUCOSE 110*  --  131*  BUN 43*   --  36*  CREATININE 1.95*  --  1.71*  CALCIUM 8.0*  --  8.1*  MG  --  2.2 2.0   Liver Function Tests:  Recent Labs  01/29/16 0649  AST 32  ALT 18  ALKPHOS 41  BILITOT 0.5  PROT 5.8*  ALBUMIN 2.5*   Recent Labs  01/29/16 0649 01/29/16 1820 01/30/16 0339  WBC 11.8* 12.6* 10.0  10.0  NEUTROABS 10.0*  --  7.8*  HGB 4.6* 7.0* 7.7*  7.7*  HCT 15.1* 22.0* 23.7*  24.1*  MCV 83.0 83.7 83.2  83.1  PLT 266 240 216  214     ASSESSMENT AND PLAN:  1. PPM pocket hematoma  Non-tender The daughter states since implant there has been some degree of "puffiness" to it but since his falls is larger  No change today, no need for any intervention, monitor  [facility] device, device check this morning shoes normal and intact function  2. PAFib  In SR currently  Xarelto/ASA on hold secondary to profound anemia He is on Tikosyn out patient, continue given he is in SR, re-discussed with Dr. Elberta Fortisamnitz today, Laya Letendre continue current  dose, his renal function Shinichi Anguiano need to be monitored daily and dose may require adjustment for renal function if no continued improvement  K+ and Mag look OK, renal function with some improvement as Hgb improved, continue to follow dose unchanged for now much of his QT is his QRS ( ) EKG on admit  3. Falls, Profound anemia  GI bleed 4. Coagulopathy 5. Vascular Dementia  Per the daughter yesterday rapidly advancing, currently DNR status and states she has discussed with medicine here getting a palliative consult Daughter-in-law at bedside this morning, again states enitre family on board with palliative care, they are no pursuing GI evaluation or w/u for his anemia  6. CAD  No CP while here Has Some degree of stable angina from what it sounds like, though has been having more frequent episodes of CP in the last couple days resolved with s/l NTG, likely exacerbated by his profound anemia with H/H of 4.6/15.1  7. Hx of CHF   No c/o SOB, O2 sats 100% on RA  Unknown EF  Daughter reports has been doing well with current diuretic regime  Monitor closely given PRBC, additional lasix as clinically indicated  We Shaquelle Hernon defer palliative care consult and management to attending MD service   Francis Dowse, PA-C 01/30/2016 8:18 AM  I have seen and examined this patient with Francis Dowse on 01/30/16.  Agree with above, note added to reflect my findings.  On exam, regular rhythm, no murmurs, lungs clear.  Had multiple falls with hematoma over pacemaker site.  Holding anticoaguliation due to anemia.  Device check shows no acute abnormalities.  Patient's family wishes to have palliative care see him and possibly wishes to have goals of care changed.  Please call EP back if necessary.    Sanae Willetts M. Oluwaseyi Raffel MD 01/31/2016 7:49 AM

## 2016-01-30 NOTE — Consult Note (Addendum)
WOC wound consult note Reason for Consult: Consult requested for head wounds and skin tears.  Pt fell down the stairs on separate occasions, according to the daughter at the bedside. Wound type: Left head with 3 areas of partial thickness abrasions; .3X.3X.1cm, 1X.5X.1cm, 1x1X.1cm.  All are covered with old dried blood.  No odor or drainage. Measurement: Left arm with healing full thickness abrasion; remaining open wound 1X.1X.1cm, pink and dry, surrounded by pink dry scar tissue. No odor or drainage. Right arm with full thickness abrasion; affected areas are approx 6X2X.1cm, with islands of intact skin in between open areas in an irregular pattern, pink and dry, surrounded by pink dry scar tissue. no odor or drainage. Right outer elbow with full thickness abrasion; remaining open wound 1X.1X.1cm, dark red and moist. Dressing procedure/placement/frequency: Neosporin to promote moist healing to head wounds.  Foam dressings to protect and promote healing to bilat arm skin tears.  Discussed plan of care with patient and family member and they verbalize understanding. Please re-consult if further assistance is needed.  Thank-you,  Cammie Mcgeeawn Jannell Franta MSN, RN, CWOCN, [place]-AP, CNS [phone]

## 2016-01-30 NOTE — Clinical Documentation Improvement (Signed)
Internal Medicine  Can the diagnosis of anemia be further specified?   Acute Blood Loss Anemia, including the suspected or known cause or associated condition(s)  Acute on chronic blood loss anemia, including the suspected or known cause or associated condition(s)  Chronic blood loss anemia, including the suspected or known cause or associated condition(s)  Precipitous drop in Hematocrit, including the suspected or known cause or associated condition(s)  Other  Clinically Undetermined  Document any associated diagnoses/conditions. Please update your documentation within the medical record to reflect your response to this query. Thank you.  Supporting Information: (As per notes) Pt with GI bleed "Symptomatic anemia due to GI bleed: Likely UGIB exacerbated by Xarelto use. "  Labs: Component     Latest Ref Rng 01/29/2016 01/29/2016 01/29/2016 01/30/2016 01/30/2016         6:43 AM  6:49 AM  6:20 PM  3:39 AM  3:39 AM  Hemoglobin     13.0 - 17.0 g/dL 5.4 (LL) 4.6 (LL) 7.0 (L) 7.7 (L) 7.7 (L)  HCT     39.0 - 52.0 % 16.0 (L) 15.1 (L) 22.0 (L) 23.7 (L) 24.1 (L)    Please exercise your independent, professional judgment when responding. A specific answer is not anticipated or expected.  Thank You, Nevin BloodgoodJoan B Eyvette Cordon, RN, BSN, CCDS,Clinical Documentation Specialist:  [phone]  [phone]=[facility]- Health Information Management

## 2016-01-30 NOTE — Evaluation (Signed)
Physical Therapy Evaluation Patient Details Name: Gary Leonard MRN: [id number] DOB: 1930/12/19 Today's Date: 01/30/2016   History of Present Illness  82 y/o male with PMH of CAD s/p CABG *3, chronic CHF, CVA, afib on xarelto, s/p PPM, vascular dementia who p/w recurrent falls. Patient is visiting daughter and has fallen 3 times in the last 3 days. Patient has also complained of worsening SOB/DOE and CP.    Clinical Impression  Patient evaluated by Physical Therapy. Patient ambulating well. Per discussion with daughter, each of the 3 falls have occurred on the steps (3 different sets of steps) with pt misjudging or not even seeing the steps. We discussed several safety measures they have put in place (pt plans to stay in [place] indefinitely). She is being put in contact with Palliative Care. If they decide not to have Palliative care for home needs, then would recommend HHPT for safety evaluation. Patient is discharging home today. PT is signing off. Thank you for this referral.     Follow Up Recommendations Home health PT;Supervision/Assistance - 24 hour (home safety evaluation IF no Palliative Care)    Equipment Recommendations  None recommended by PT    Recommendations for Other Services       Precautions / Restrictions Precautions Precautions: Fall Precaution Comments: Daughter reports each fall has involved a different set of steps (the last being down ~8 steps when pt up at 4 am trying to find the bathroom      Mobility  Bed Mobility                  Transfers Overall transfer level: Needs assistance Equipment used: None Transfers: Sit to/from Stand Sit to Stand: Supervision         General transfer comment: supervision for safety  Ambulation/Gait Ambulation/Gait assistance: Min guard Ambulation Distance (Feet): 200 Feet Assistive device: None Gait Pattern/deviations: WFL(Within Functional Limits)   Gait velocity interpretation: at or above normal speed  for age/gender General Gait Details: no drift or imbalance  Stairs            Wheelchair Mobility    Modified Rankin (Stroke Patients Only)       Balance Overall balance assessment: Needs assistance         Standing balance support: No upper extremity supported Standing balance-Leahy Scale: Good           Rhomberg - Eyes Opened: 60 Rhomberg - Eyes Closed: 10 (slight incr sway and pt opens his eyes) High level balance activites: Direction changes;Turns;Head turns High Level Balance Comments: no difficulties             Pertinent Vitals/Pain Pain Assessment: No/denies pain    Home Living Family/patient expects to be discharged to:: Private residence Living Arrangements: Children (son and daughter) Available Help at Discharge: Family Type of Home: House Home Access: Stairs to enter     Home Layout: Bed/bath upstairs;Multi-level Home Equipment: Other (comment) (gate for across stairs (was not well secured))      Prior Function Level of Independence: Needs assistance   Gait / Transfers Assistance Needed: Supervision for dementia; had not previously fallen since May 2016           Hand Dominance        Extremity/Trunk Assessment   Upper Extremity Assessment: Overall WFL for tasks assessed           Lower Extremity Assessment: Overall WFL for tasks assessed      Cervical / Trunk Assessment: Normal  Communication  Communication: HOH  Cognition Arousal/Alertness: Awake/alert Behavior During Therapy: WFL for tasks assessed/performed Overall Cognitive Status: History of cognitive impairments - at baseline                      General Comments General comments (skin integrity, edema, etc.): daughter present and provided information. We discussed either Palliative care or HHPT could provide her with ideas/tips to incr home safety    Exercises        Assessment/Plan    PT Assessment All further PT needs can be met in the  next venue of care  PT Diagnosis Difficulty walking   PT Problem List Decreased mobility;Decreased balance;Decreased cognition  PT Treatment Interventions     PT Goals (Current goals can be found in the Care Plan section) Acute Rehab PT Goals Patient Stated Goal: walk better PT Goal Formulation: All assessment and education complete, DC therapy    Frequency     Barriers to discharge        Co-evaluation               End of Session Equipment Utilized During Treatment: Gait belt Activity Tolerance: Patient tolerated treatment well Patient left: in bed;with call bell/phone within reach;with family/visitor present Nurse Communication: Mobility status         Time: 6940-9828 PT Time Calculation (min) (ACUTE ONLY): 25 min   Charges:   PT Evaluation $PT Eval Low Complexity: 1 Procedure PT Treatments $Gait Training: 8-22 mins   PT G Codes:        Gary Leonard 2016-02-04, 3:22 PM  Pager [phone]

## 2016-01-31 DIAGNOSIS — S51801D Unspecified open wound of right forearm, subsequent encounter: Secondary | ICD-10-CM | POA: Diagnosis not present

## 2016-01-31 DIAGNOSIS — I509 Heart failure, unspecified: Secondary | ICD-10-CM | POA: Diagnosis not present

## 2016-01-31 DIAGNOSIS — T148XXA Other injury of unspecified body region, initial encounter: Secondary | ICD-10-CM | POA: Insufficient documentation

## 2016-01-31 DIAGNOSIS — K922 Gastrointestinal hemorrhage, unspecified: Secondary | ICD-10-CM | POA: Diagnosis not present

## 2016-01-31 DIAGNOSIS — Z951 Presence of aortocoronary bypass graft: Secondary | ICD-10-CM | POA: Diagnosis not present

## 2016-01-31 DIAGNOSIS — F0150 Vascular dementia without behavioral disturbance: Secondary | ICD-10-CM | POA: Diagnosis not present

## 2016-01-31 DIAGNOSIS — Z950 Presence of cardiac pacemaker: Secondary | ICD-10-CM | POA: Diagnosis not present

## 2016-01-31 DIAGNOSIS — R296 Repeated falls: Secondary | ICD-10-CM | POA: Diagnosis not present

## 2016-01-31 DIAGNOSIS — I110 Hypertensive heart disease with heart failure: Secondary | ICD-10-CM | POA: Diagnosis not present

## 2016-01-31 DIAGNOSIS — S51802D Unspecified open wound of left forearm, subsequent encounter: Secondary | ICD-10-CM | POA: Diagnosis not present

## 2016-01-31 DIAGNOSIS — Z87891 Personal history of nicotine dependence: Secondary | ICD-10-CM | POA: Diagnosis not present

## 2016-01-31 DIAGNOSIS — I2510 Atherosclerotic heart disease of native coronary artery without angina pectoris: Secondary | ICD-10-CM | POA: Diagnosis not present

## 2016-01-31 DIAGNOSIS — D649 Anemia, unspecified: Secondary | ICD-10-CM | POA: Diagnosis not present

## 2016-01-31 DIAGNOSIS — I4891 Unspecified atrial fibrillation: Secondary | ICD-10-CM | POA: Diagnosis not present

## 2016-01-31 LAB — TYPE AND SCREEN
ABO/RH(D): A POS
ANTIBODY SCREEN: NEGATIVE
UNIT DIVISION: 0
Unit division: 0
Unit division: 0
Unit division: 0

## 2016-02-01 NOTE — Discharge Summary (Signed)
Name: Gary Leonard MRN: [id number] DOB: [birth year] 82 y.o. PCP: No Pcp Per Patient  Date of Admission: 01/29/2016  5:50 AM Date of Discharge: 02/01/2016 Attending Physician: No att. providers found  Discharge Diagnosis: 1. Upper GI bleed  Principal Problem:   GI bleed Active Problems:   Symptomatic anemia   AKI (acute kidney injury) (HCC)   Vascular dementia   Presence of permanent cardiac pacemaker   Atrial fibrillation (HCC)   Long term (current) use of anticoagulants   Hypertension   Pacemaker pocket hematoma   Hematoma  Discharge Medications:   Medication List    STOP taking these medications        aspirin EC 81 MG tablet     Rivaroxaban 15 MG Tabs tablet  Commonly known as:  XARELTO      TAKE these medications        dofetilide 500 MCG capsule  Commonly known as:  TIKOSYN  Take 500 mcg by mouth 2 (two) times daily.     donepezil 10 MG tablet  Commonly known as:  ARICEPT  Take 10 mg by mouth at bedtime.     escitalopram 10 MG tablet  Commonly known as:  LEXAPRO  Take 10 mg by mouth daily.     furosemide 40 MG tablet  Commonly known as:  LASIX  Take 40 mg by mouth.     isosorbide mononitrate 120 MG 24 hr tablet  Commonly known as:  IMDUR  Take 120 mg by mouth daily.     LORazepam 1 MG tablet  Commonly known as:  ATIVAN  Take 1 mg by mouth every 8 (eight) hours.     nebivolol 10 MG tablet  Commonly known as:  BYSTOLIC  Take 10 mg by mouth daily.     nitroGLYCERIN 0.4 MG SL tablet  Commonly known as:  NITROSTAT  Place 0.4 mg under the tongue every 5 (five) minutes as needed for chest pain.     pantoprazole 40 MG tablet  Commonly known as:  PROTONIX  Take 40 mg by mouth daily.     potassium chloride 10 MEQ tablet  Commonly known as:  K-DUR  Take 10 mEq by mouth daily.     ramipril 2.5 MG capsule  Commonly known as:  ALTACE  Take 2.5 mg by mouth 2 (two) times daily.     risperiDONE 0.25 MG tablet  Commonly known as:  RISPERDAL  Take  0.25 mg by mouth at bedtime.     rosuvastatin 20 MG tablet  Commonly known as:  CRESTOR  Take 20 mg by mouth daily.        Disposition and follow-up:   [name] was discharged from [facility] in Good condition.  At the hospital follow up visit please address:  1.  Any recurrence of chest pain, shortness of breath, melena, falls? Have home health services come to the home? Any other needs at home (living with family here)? May consider restarting ASA +/- Xarelto (likely just ASA for MI and CVA 2ndary prevention) if CBC is still >8 and no melena (family preference)?  2.  Labs / imaging needed at time of follow-up: CBC  3.  Pending labs/ test needing follow-up: None  Follow-up Appointments:     Follow-up Information    Follow up with [facility].   Why:  HH RN HHA PT OT SW   Contact information:   [address] [place] [address] [phone]  Follow up with [facility].   Why:  WC to be delivered to room prior to [employer] information:   [address] [place] [address] [phone]       Follow up with Hospice at [facility].   Specialty:  Hospice and Palliative Medicine   Why:  For palliative follow up    Contact information:   [address] [place] [address] [phone]       Follow up with Joycelyn Rua, MD. Go on 02/13/2016.   Specialty:  Family Medicine   Why:  at 1:30. Bring insurance cards and arrive 15 minutes early. You will be seen by Lovenia Kim PA (in order to get timely appointment, but will follow Dr Lenise Arena for PCP)   Contact information:   [address] [place] [address] [phone]       Discharge Instructions: Discharge Instructions    Diet - low sodium heart healthy    Complete by:  As directed      Increase activity slowly    Complete by:  As directed            Consultations: Treatment Team:  Rounding Lbcardiology,  MD  Procedures Performed:  Ct Head Wo Contrast  01/29/2016  CLINICAL DATA:  Pain after fall. EXAM: CT HEAD WITHOUT CONTRAST CT CERVICAL SPINE WITHOUT CONTRAST TECHNIQUE: Multidetector CT imaging of the head and cervical spine was performed following the standard protocol without intravenous contrast. Multiplanar CT image reconstructions of the cervical spine were also generated. COMPARISON:  None. FINDINGS: CT HEAD FINDINGS There is mild mucosal thickening in the right maxillary sinus. The paranasal sinuses and mastoid air cells are otherwise normal. The bones are intact. Extracranial soft tissues are normal. There is a tiny sliver of high attenuation over the left parietal region, which is very linear in configuration, consistent with either beam hardening artifact or a tiny amount of calcification. This does not have the appearance of an acute hematoma. No epidural or subarachnoid hemorrhage identified. No mass, mass effect, or midline shift. The ventricles and sulci are mildly prominent, likely due to age related atrophy. Moderate white matter changes are seen. No acute cortical ischemia or infarct. The cerebellum, brainstem, and basal cisterns are normal. CT CERVICAL SPINE FINDINGS There is a right-sided pleural effusion. No traumatic malalignment is identified. Minimal anterolisthesis of C4 versus C5 is likely degenerative and chronic. No fractures are seen. Multilevel degenerative changes are seen. IMPRESSION: 1. No acute intracranial abnormalities. 2. Degenerative changes in the cervical spine with no traumatic malalignment or fracture. 3. Right pleural effusion. Electronically Signed   By: Gerome Sam III M.D   On: 01/29/2016 08:58   Ct Cervical Spine Wo Contrast  01/29/2016  CLINICAL DATA:  Pain after fall. EXAM: CT HEAD WITHOUT CONTRAST CT CERVICAL SPINE WITHOUT CONTRAST TECHNIQUE: Multidetector CT imaging of the head and cervical spine was performed following the standard protocol without  intravenous contrast. Multiplanar CT image reconstructions of the cervical spine were also generated. COMPARISON:  None. FINDINGS: CT HEAD FINDINGS There is mild mucosal thickening in the right maxillary sinus. The paranasal sinuses and mastoid air cells are otherwise normal. The bones are intact. Extracranial soft tissues are normal. There is a tiny sliver of high attenuation over the left parietal region, which is very linear in configuration, consistent with either beam hardening artifact or a tiny amount of calcification. This does not have the appearance of an acute hematoma. No  epidural or subarachnoid hemorrhage identified. No mass, mass effect, or midline shift. The ventricles and sulci are mildly prominent, likely due to age related atrophy. Moderate white matter changes are seen. No acute cortical ischemia or infarct. The cerebellum, brainstem, and basal cisterns are normal. CT CERVICAL SPINE FINDINGS There is a right-sided pleural effusion. No traumatic malalignment is identified. Minimal anterolisthesis of C4 versus C5 is likely degenerative and chronic. No fractures are seen. Multilevel degenerative changes are seen. IMPRESSION: 1. No acute intracranial abnormalities. 2. Degenerative changes in the cervical spine with no traumatic malalignment or fracture. 3. Right pleural effusion. Electronically Signed   By: Gerome Sam III M.D   On: 01/29/2016 08:58   [place] Chest  01/29/2016  CLINICAL DATA:  Hematoma in region of pacemaker, anterior left chest wall region. Recent fall EXAM: ANTERIOR LEFT CHEST WALL REGION ULTRASOUND COMPARISON:  Chest radiograph Jan 29, 2016 FINDINGS: Longitudinal and transverse images obtained in the anterior left chest wall in the region of soft tissue fullness in the pacemaker insertion site region. There is moderate fluid surrounding the proximal pacemaker lead on the left anteriorly. There is also soft tissue thickening in this area, likely representing a degree of fibrinous  tissue as well as surrounding soft tissue muscle. A well-defined abscess is not seen in this area. IMPRESSION: Probable liquefying hematoma in the left anterior chest wall region in the area of palpable fullness. There appears to be potential for breast tissue also surrounding the proximal pacemaker lead. Well-defined mass or abscess not seen in this area. If further evaluation is felt to be warranted, chest CT, ideally with intravenous contrast, could be helpful to further assess. Electronically Signed   By: Bretta Bang III M.D.   On: 01/29/2016 10:40   Dg Chest Port 1 View  01/29/2016  CLINICAL DATA:  Shortness of breath.  Altered mental status. EXAM: PORTABLE CHEST 1 VIEW COMPARISON:  None. FINDINGS: No pneumothorax. Cardiomegaly is identified. The hila and mediastinum are unremarkable. A 2 lead pacemaker is seen. No pulmonary nodules or masses. No focal infiltrates. Haziness over the right lower hemi thorax is likely layering effusion given the effusion seen on the CT of the cervical spine. A PA and lateral chest x-ray could better evaluate. IMPRESSION: Right pleural effusion, better appreciated on the CT of the cervical spine from earlier today. Cardiomegaly. Electronically Signed   By: Gerome Sam III M.D   On: 01/29/2016 10:09    2D Echo: None  Cardiac Cath: None  Admission HPI: [name] is an 82 year old male with PMH of CAD (s/p CABG in 1979, 1988, 2002), chronic CHF (dx a few months ago), CVA in 1970s, Afib on Xarelto (for many years), has PPM, vascular dementia dx 01/2015 and HTN. He is visiting from [place]. Daughter brought him in because of fall around 4AM this AM. She says for the past 3 days he has complained of increased DOE and CP and has had increased falls (today is 3rd fall in 3 days). He has not experienced LOC. Fell down stairs last two falls. The first was a minor fall down one stair. Today he fell down ~8 carpeted steps and hit head on wall as he was falling. He has had  c/o of central chest pain at rest and with exertion as well as dyspnea in the past three days. He usually takes about 3 NTG per month but he has required 3 per day in the past two days. The chest pain does respond to  NTG. He has not had orthopnea, PND. He sometimes has leg swelling B/L, R> L (right hip surg) . Daughter reports good outpatient follow-up in [place], [place]. She says his doctors suspect he suffered a silent MI a few months ago and have given him a guarded long-term prognosis of likely months and not years.   Hospital Course by problem list: Principal Problem:   GI bleed Active Problems:   Symptomatic anemia   AKI (acute kidney injury) (HCC)   Vascular dementia   Presence of permanent cardiac pacemaker   Atrial fibrillation (HCC)   Long term (current) use of anticoagulants   Hypertension   Pacemaker pocket hematoma   Hematoma   1. Upper GI bleed -  Presented with symptomatic anemia from UGIB thought exacerbated by Xarelto and ASA use. Unclear etiology. Daughter says the patient has had prior c-scope but no known pathology. No hx of GI bleed. Patient with worsening vascular dementia and daughter says he has made his wishes clear in his living will and would not want aggressive measures, and was therefore not interested in aggressive care, colonoscopy, surgery. Hemoglobin was 4.7 on initial presentation. EKG was without acute changes. The patient initially received 3U PRBCs with improvement in his Hgb to 7.7 without recurrent melena and great improvement in his symptoms "back to his baseline". He received 1 more unit for 4U total the next day. The patient felt well, was ambulating without difficulty, and without recurrent signs or symptoms of bleeding while Xarelto and ASA were held. Appointment in our clinic in 2 weeks' time for a CBC recheck was arranged.  2. Vascular dementia - given the patient was 'visiting' family from [place], and most likely living with them for at least the  foreseeable short-term here in [place], the family expressed desire for home health services. We placed a referral for home health services prior to discharge.  Discharge Vitals:   BP 124/65 mmHg  Pulse 61  Temp(Src) 97.5 F (36.4 C) (Oral)  Resp 18  Ht 5\' 8"  (1.727 m)  Wt 369 lb 14.9 oz (167.8 kg)  BMI 56.26 kg/m2  SpO2 100%  Discharge Labs:  Results for orders placed or performed during the hospital encounter of 01/29/16 (from the past 24 hour(s))  BLOOD TRANSFUSION REPORT - SCANNED     Status: None   Collection Time: 01/31/16 11:26 AM   Narrative   Ordered by an unspecified provider.    Signed: Darrick HuntsmanWilliam R Billiejean Schimek, MD 02/01/2016, 11:20 AM    Services Ordered on Discharge: HHS Equipment Ordered on Discharge: Wheelchair

## 2016-02-02 DIAGNOSIS — D649 Anemia, unspecified: Secondary | ICD-10-CM | POA: Diagnosis not present

## 2016-02-02 DIAGNOSIS — I2510 Atherosclerotic heart disease of native coronary artery without angina pectoris: Secondary | ICD-10-CM | POA: Diagnosis not present

## 2016-02-02 DIAGNOSIS — I509 Heart failure, unspecified: Secondary | ICD-10-CM | POA: Diagnosis not present

## 2016-02-02 DIAGNOSIS — K922 Gastrointestinal hemorrhage, unspecified: Secondary | ICD-10-CM | POA: Diagnosis not present

## 2016-02-02 DIAGNOSIS — I110 Hypertensive heart disease with heart failure: Secondary | ICD-10-CM | POA: Diagnosis not present

## 2016-02-02 DIAGNOSIS — R296 Repeated falls: Secondary | ICD-10-CM | POA: Diagnosis not present

## 2016-02-03 DIAGNOSIS — R296 Repeated falls: Secondary | ICD-10-CM | POA: Diagnosis not present

## 2016-02-03 DIAGNOSIS — D649 Anemia, unspecified: Secondary | ICD-10-CM | POA: Diagnosis not present

## 2016-02-03 DIAGNOSIS — K922 Gastrointestinal hemorrhage, unspecified: Secondary | ICD-10-CM | POA: Diagnosis not present

## 2016-02-03 DIAGNOSIS — I110 Hypertensive heart disease with heart failure: Secondary | ICD-10-CM | POA: Diagnosis not present

## 2016-02-03 DIAGNOSIS — I509 Heart failure, unspecified: Secondary | ICD-10-CM | POA: Diagnosis not present

## 2016-02-03 DIAGNOSIS — I2510 Atherosclerotic heart disease of native coronary artery without angina pectoris: Secondary | ICD-10-CM | POA: Diagnosis not present

## 2016-02-04 DIAGNOSIS — R531 Weakness: Secondary | ICD-10-CM | POA: Diagnosis not present

## 2016-02-05 DIAGNOSIS — R296 Repeated falls: Secondary | ICD-10-CM | POA: Diagnosis not present

## 2016-02-05 DIAGNOSIS — I509 Heart failure, unspecified: Secondary | ICD-10-CM | POA: Diagnosis not present

## 2016-02-05 DIAGNOSIS — D649 Anemia, unspecified: Secondary | ICD-10-CM | POA: Diagnosis not present

## 2016-02-05 DIAGNOSIS — I2510 Atherosclerotic heart disease of native coronary artery without angina pectoris: Secondary | ICD-10-CM | POA: Diagnosis not present

## 2016-02-05 DIAGNOSIS — K922 Gastrointestinal hemorrhage, unspecified: Secondary | ICD-10-CM | POA: Diagnosis not present

## 2016-02-05 DIAGNOSIS — I110 Hypertensive heart disease with heart failure: Secondary | ICD-10-CM | POA: Diagnosis not present

## 2016-02-06 DIAGNOSIS — I2510 Atherosclerotic heart disease of native coronary artery without angina pectoris: Secondary | ICD-10-CM | POA: Diagnosis not present

## 2016-02-06 DIAGNOSIS — R296 Repeated falls: Secondary | ICD-10-CM | POA: Diagnosis not present

## 2016-02-06 DIAGNOSIS — D649 Anemia, unspecified: Secondary | ICD-10-CM | POA: Diagnosis not present

## 2016-02-06 DIAGNOSIS — K922 Gastrointestinal hemorrhage, unspecified: Secondary | ICD-10-CM | POA: Diagnosis not present

## 2016-02-06 DIAGNOSIS — I509 Heart failure, unspecified: Secondary | ICD-10-CM | POA: Diagnosis not present

## 2016-02-06 DIAGNOSIS — I110 Hypertensive heart disease with heart failure: Secondary | ICD-10-CM | POA: Diagnosis not present

## 2016-02-10 DIAGNOSIS — I110 Hypertensive heart disease with heart failure: Secondary | ICD-10-CM | POA: Diagnosis not present

## 2016-02-10 DIAGNOSIS — I2510 Atherosclerotic heart disease of native coronary artery without angina pectoris: Secondary | ICD-10-CM | POA: Diagnosis not present

## 2016-02-10 DIAGNOSIS — D649 Anemia, unspecified: Secondary | ICD-10-CM | POA: Diagnosis not present

## 2016-02-10 DIAGNOSIS — R296 Repeated falls: Secondary | ICD-10-CM | POA: Diagnosis not present

## 2016-02-10 DIAGNOSIS — I509 Heart failure, unspecified: Secondary | ICD-10-CM | POA: Diagnosis not present

## 2016-02-10 DIAGNOSIS — K922 Gastrointestinal hemorrhage, unspecified: Secondary | ICD-10-CM | POA: Diagnosis not present

## 2016-02-12 ENCOUNTER — Ambulatory Visit: Payer: Medicare Other | Admitting: Internal Medicine

## 2016-02-13 DIAGNOSIS — I110 Hypertensive heart disease with heart failure: Secondary | ICD-10-CM | POA: Diagnosis not present

## 2016-02-13 DIAGNOSIS — R296 Repeated falls: Secondary | ICD-10-CM | POA: Diagnosis not present

## 2016-02-13 DIAGNOSIS — K219 Gastro-esophageal reflux disease without esophagitis: Secondary | ICD-10-CM | POA: Diagnosis not present

## 2016-02-13 DIAGNOSIS — I509 Heart failure, unspecified: Secondary | ICD-10-CM | POA: Diagnosis not present

## 2016-02-13 DIAGNOSIS — K922 Gastrointestinal hemorrhage, unspecified: Secondary | ICD-10-CM | POA: Diagnosis not present

## 2016-02-13 DIAGNOSIS — I10 Essential (primary) hypertension: Secondary | ICD-10-CM | POA: Diagnosis not present

## 2016-02-13 DIAGNOSIS — D62 Acute posthemorrhagic anemia: Secondary | ICD-10-CM | POA: Diagnosis not present

## 2016-02-13 DIAGNOSIS — D649 Anemia, unspecified: Secondary | ICD-10-CM | POA: Diagnosis not present

## 2016-02-13 DIAGNOSIS — I480 Paroxysmal atrial fibrillation: Secondary | ICD-10-CM | POA: Diagnosis not present

## 2016-02-13 DIAGNOSIS — F0151 Vascular dementia with behavioral disturbance: Secondary | ICD-10-CM | POA: Diagnosis not present

## 2016-02-13 DIAGNOSIS — Z950 Presence of cardiac pacemaker: Secondary | ICD-10-CM | POA: Diagnosis not present

## 2016-02-13 DIAGNOSIS — I2510 Atherosclerotic heart disease of native coronary artery without angina pectoris: Secondary | ICD-10-CM | POA: Diagnosis not present

## 2016-02-17 DIAGNOSIS — I2510 Atherosclerotic heart disease of native coronary artery without angina pectoris: Secondary | ICD-10-CM | POA: Diagnosis not present

## 2016-02-17 DIAGNOSIS — I110 Hypertensive heart disease with heart failure: Secondary | ICD-10-CM | POA: Diagnosis not present

## 2016-02-17 DIAGNOSIS — R296 Repeated falls: Secondary | ICD-10-CM | POA: Diagnosis not present

## 2016-02-17 DIAGNOSIS — K922 Gastrointestinal hemorrhage, unspecified: Secondary | ICD-10-CM | POA: Diagnosis not present

## 2016-02-17 DIAGNOSIS — I509 Heart failure, unspecified: Secondary | ICD-10-CM | POA: Diagnosis not present

## 2016-02-17 DIAGNOSIS — D649 Anemia, unspecified: Secondary | ICD-10-CM | POA: Diagnosis not present

## 2016-02-19 DIAGNOSIS — R296 Repeated falls: Secondary | ICD-10-CM | POA: Diagnosis not present

## 2016-02-19 DIAGNOSIS — I509 Heart failure, unspecified: Secondary | ICD-10-CM | POA: Diagnosis not present

## 2016-02-19 DIAGNOSIS — I110 Hypertensive heart disease with heart failure: Secondary | ICD-10-CM | POA: Diagnosis not present

## 2016-02-19 DIAGNOSIS — K922 Gastrointestinal hemorrhage, unspecified: Secondary | ICD-10-CM | POA: Diagnosis not present

## 2016-02-19 DIAGNOSIS — D649 Anemia, unspecified: Secondary | ICD-10-CM | POA: Diagnosis not present

## 2016-02-19 DIAGNOSIS — I2510 Atherosclerotic heart disease of native coronary artery without angina pectoris: Secondary | ICD-10-CM | POA: Diagnosis not present

## 2016-02-24 DIAGNOSIS — K922 Gastrointestinal hemorrhage, unspecified: Secondary | ICD-10-CM | POA: Diagnosis not present

## 2016-02-24 DIAGNOSIS — I110 Hypertensive heart disease with heart failure: Secondary | ICD-10-CM | POA: Diagnosis not present

## 2016-02-24 DIAGNOSIS — D649 Anemia, unspecified: Secondary | ICD-10-CM | POA: Diagnosis not present

## 2016-02-24 DIAGNOSIS — I509 Heart failure, unspecified: Secondary | ICD-10-CM | POA: Diagnosis not present

## 2016-02-24 DIAGNOSIS — R296 Repeated falls: Secondary | ICD-10-CM | POA: Diagnosis not present

## 2016-02-24 DIAGNOSIS — I2510 Atherosclerotic heart disease of native coronary artery without angina pectoris: Secondary | ICD-10-CM | POA: Diagnosis not present

## 2016-02-26 DIAGNOSIS — I2510 Atherosclerotic heart disease of native coronary artery without angina pectoris: Secondary | ICD-10-CM | POA: Diagnosis not present

## 2016-02-26 DIAGNOSIS — I110 Hypertensive heart disease with heart failure: Secondary | ICD-10-CM | POA: Diagnosis not present

## 2016-02-26 DIAGNOSIS — K922 Gastrointestinal hemorrhage, unspecified: Secondary | ICD-10-CM | POA: Diagnosis not present

## 2016-02-26 DIAGNOSIS — T827XXA Infection and inflammatory reaction due to other cardiac and vascular devices, implants and grafts, initial encounter: Secondary | ICD-10-CM | POA: Diagnosis not present

## 2016-02-26 DIAGNOSIS — I509 Heart failure, unspecified: Secondary | ICD-10-CM | POA: Diagnosis not present

## 2016-02-26 DIAGNOSIS — R296 Repeated falls: Secondary | ICD-10-CM | POA: Diagnosis not present

## 2016-02-26 DIAGNOSIS — D649 Anemia, unspecified: Secondary | ICD-10-CM | POA: Diagnosis not present

## 2016-03-02 DIAGNOSIS — I110 Hypertensive heart disease with heart failure: Secondary | ICD-10-CM | POA: Diagnosis not present

## 2016-03-02 DIAGNOSIS — I509 Heart failure, unspecified: Secondary | ICD-10-CM | POA: Diagnosis not present

## 2016-03-02 DIAGNOSIS — R296 Repeated falls: Secondary | ICD-10-CM | POA: Diagnosis not present

## 2016-03-02 DIAGNOSIS — L02213 Cutaneous abscess of chest wall: Secondary | ICD-10-CM | POA: Diagnosis not present

## 2016-03-02 DIAGNOSIS — D649 Anemia, unspecified: Secondary | ICD-10-CM | POA: Diagnosis not present

## 2016-03-02 DIAGNOSIS — I2510 Atherosclerotic heart disease of native coronary artery without angina pectoris: Secondary | ICD-10-CM | POA: Diagnosis not present

## 2016-03-02 DIAGNOSIS — K922 Gastrointestinal hemorrhage, unspecified: Secondary | ICD-10-CM | POA: Diagnosis not present

## 2016-03-04 ENCOUNTER — Encounter: Payer: Self-pay | Admitting: Cardiology

## 2016-03-08 NOTE — Progress Notes (Signed)
Cardiology Office Note   Date:  03/09/2016   ID:  Gary Leonard, DOB Dec 29, 1930, MRN [id number]  PCP:  Gary Rua, MD  Cardiologist:   Gary Lemming, MD    Chief Complaint  Patient presents with  . Pacemaker Check     History of Present Illness: Gary Leonard is a 82 y.o. male who presents today for cardiology evaluation.  Hx CAD (s/p CABG in 1979, 1988, 2002), chronic CHF (dx a few months ago), CVA in 1970s, Afib on Xarelto (for many years), has PPM, vascular dementia dx 01/2015 and HTN. Hospitalized in May with a fall.  He was having SOB and CP prior to the fall but no LOC.  He was having CP with exertion associated with dyspnea.  He was requiring more NTG than usual.  Was found to have a UGIB with a Hb of 4.7.  Due to vascular dementia, no aggressive therapy was chosen.  Received 4U PRBC.  Xarelto and ASA were held.  Since that time, his pacemaker site has started draining purulent fluid. According to his daughter, she is able to see the pacemaker through the wound. She says that they have been dressing the wound. He was initially followed in [place], and had his 3 cardiac surgeries at [facility].   Today, he denies symptoms of palpitations, chest pain, shortness of breath, orthopnea, PND, lower extremity edema, claudication, dizziness, presyncope, syncope, bleeding, or neurologic sequela. The patient is tolerating medications without difficulties and is otherwise without complaint today.    Past Medical History  Diagnosis Date  . Coronary artery disease   . Atrial fibrillation (HCC)   . Chronic CHF (congestive heart failure) (HCC)   . Vascular dementia   . Hypertension   . Complication of anesthesia     DIFFICULT WAKING UP   . Myocardial infarction (HCC) 1979  . Presence of permanent cardiac pacemaker   . GERD (gastroesophageal reflux disease)   . Arthritis   . GI bleed 01/2016   Past Surgical History  Procedure Laterality Date  . Cardiac surgery      open heart x 3    . Total hip arthroplasty Left   . Hernia repair    . Coronary artery bypass graft      1979    1988   . Total shoulder arthroplasty Left      Current Outpatient Prescriptions  Medication Sig Dispense Refill  . dofetilide (TIKOSYN) 500 MCG capsule Take 500 mcg by mouth 2 (two) times daily.    Marland Kitchen donepezil (ARICEPT) 10 MG tablet Take 10 mg by mouth at bedtime.    Marland Kitchen escitalopram (LEXAPRO) 10 MG tablet Take 10 mg by mouth daily.    . furosemide (LASIX) 40 MG tablet Take 40 mg by mouth.    . isosorbide mononitrate (IMDUR) 120 MG 24 hr tablet Take 120 mg by mouth daily.    Marland Kitchen LORazepam (ATIVAN) 1 MG tablet Take 1 mg by mouth as directed.     . nebivolol (BYSTOLIC) 10 MG tablet Take 10 mg by mouth daily.    . nitroGLYCERIN (NITROSTAT) 0.4 MG SL tablet Place 0.4 mg under the tongue every 5 (five) minutes as needed for chest pain.    . pantoprazole (PROTONIX) 40 MG tablet Take 40 mg by mouth daily.    . potassium chloride (K-DUR) 10 MEQ tablet Take 10 mEq by mouth daily.    . ramipril (ALTACE) 2.5 MG capsule Take 2.5 mg by mouth 2 (two) times daily.    Marland Kitchen  risperiDONE (RISPERDAL) 0.25 MG tablet Take 0.25 mg by mouth at bedtime.    . rosuvastatin (CRESTOR) 20 MG tablet Take 20 mg by mouth daily.     No current facility-administered medications for this visit.    Allergies:   Review of patient's allergies indicates no known allergies.   Social History:  The patient  reports that he quit smoking about 43 years ago. His smoking use included Cigarettes. He quit after 40 years of use. He has never used smokeless tobacco. He reports that he does not drink alcohol or use illicit drugs.   Family History:  The patient's family history includes Heart disease in his sister.    ROS:  Please see the history of pre, chset pain, leg swelling the device interrogated and is normal is sent illness.   Otherwise, review of systems is positive for DOE.   All other systems are reviewed and negative.    PHYSICAL  EXAM: VS:  BP 104/62 mmHg  Pulse 63  Ht 5\' 7"  (1.702 m)  Wt 163 lb 1.9 oz (73.991 kg)  BMI 25.54 kg/m2  SpO2 95% , BMI Body mass index is 25.54 kg/(m^2). GEN: Well nourished, well developed, in no acute distress HEENT: normal Neck: no JVD, carotid bruits, or masses Cardiac: Regular ; no murmurs, rubs, or gallops,no edema  Respiratory:  clear to auscultation bilaterally, normal work of breathing GI: soft, nontender, nondistended, + BS MS: no deformity or atrophy Skin: warm and dry,  device pocket has a 3 mm hole with the device visible and purulent fluid visible and draining Neuro:  Strength and sensation are intact Psych: euthymic mood, full affect  EKG:  EKG is not ordered today.  Recent Labs: 01/29/2016: ALT 18 01/30/2016: BUN 36*; Creatinine, Ser 1.71*; Hemoglobin 7.7*; Hemoglobin 7.7*; Magnesium 2.0; Platelets 216; Platelets 214; Potassium 4.2; Sodium 136    Lipid Panel  No results found for: CHOL, TRIG, HDL, CHOLHDL, VLDL, LDLCALC, LDLDIRECT   Wt Readings from Last 3 Encounters:  03/09/16 163 lb 1.9 oz (73.991 kg)  01/30/16 369 lb 14.9 oz (167.8 kg)      Other studies Reviewed: Additional studies/ records that were reviewed today include: Epic and PCP notes     ASSESSMENT AND PLAN:  1.  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 Extraction is therefore necessary. Fortunately he is not pacemaker dependent with sinus rhythm in the 30s at his baseline. We'll plan for extraction with the assistance of cardiac surgery. Discussed the procedure with both him and his daughter. Discussed the risks and benefits. He has had a sternotomy in the past, therefore the risks of the operation are quite a bit less as far as bleeding. Did tell him that there is a small risk of tearing of vein and significant bleeding. We'll likely have to place a semipermanent device post procedure. We'll work to schedule as soon as possible.    Current medicines are reviewed at  length with the patient today.   The patient does not have concerns regarding his medicines.  The following changes were made today:  none  Labs/ tests ordered today include:  Orders Placed This Encounter  Procedures  . Implantable device check     Disposition:   FU with Vernice Mannina 3 months  Signed, Ilian Wessell Jorja LoaMartin Shashana Fullington, MD  03/09/2016 4:31 PM     [facility] [address] [place] [address] [phone] (office) [phone] (fax)

## 2016-03-09 ENCOUNTER — Encounter: Payer: Self-pay | Admitting: Cardiology

## 2016-03-09 ENCOUNTER — Ambulatory Visit (INDEPENDENT_AMBULATORY_CARE_PROVIDER_SITE_OTHER): Payer: Medicare Other | Admitting: Cardiology

## 2016-03-09 VITALS — BP 104/62 | HR 63 | Ht 67.0 in | Wt 163.1 lb

## 2016-03-09 DIAGNOSIS — Z950 Presence of cardiac pacemaker: Secondary | ICD-10-CM

## 2016-03-09 DIAGNOSIS — T827XXA Infection and inflammatory reaction due to other cardiac and vascular devices, implants and grafts, initial encounter: Secondary | ICD-10-CM

## 2016-03-09 DIAGNOSIS — I480 Paroxysmal atrial fibrillation: Secondary | ICD-10-CM

## 2016-03-09 LAB — CUP PACEART INCLINIC DEVICE CHECK
Brady Statistic RA Percent Paced: 89 %
Brady Statistic RV Percent Paced: 97 %
Date Time Interrogation Session: 20170613040000
Implantable Lead Implant Date: 20120329
Implantable Lead Model: 4470
Implantable Lead Serial Number: 546932
Lead Channel Pacing Threshold Pulse Width: 0.4 ms
Lead Channel Sensing Intrinsic Amplitude: 0.4 mV
Lead Channel Setting Pacing Pulse Width: 0.4 ms
Lead Channel Setting Sensing Sensitivity: 2.5 mV
MDC IDC LEAD IMPLANT DT: 20120329
MDC IDC LEAD LOCATION: 753859
MDC IDC LEAD LOCATION: 753860
MDC IDC LEAD SERIAL: 692626
MDC IDC MSMT LEADCHNL RA IMPEDANCE VALUE: 440 Ohm
MDC IDC MSMT LEADCHNL RA PACING THRESHOLD AMPLITUDE: 1 V
MDC IDC MSMT LEADCHNL RA PACING THRESHOLD PULSEWIDTH: 0.4 ms
MDC IDC MSMT LEADCHNL RV IMPEDANCE VALUE: 530 Ohm
MDC IDC MSMT LEADCHNL RV PACING THRESHOLD AMPLITUDE: 1 V
MDC IDC PG SERIAL: 187015
MDC IDC SET LEADCHNL RA PACING AMPLITUDE: 2.4 V
MDC IDC SET LEADCHNL RV PACING AMPLITUDE: 1.3 V

## 2016-03-09 NOTE — Patient Instructions (Addendum)
Medication Instructions:  Your physician recommends that you continue on your current medications as directed. Please refer to the Current Medication list given to you today.  Labwork: None  ordered  Testing/Procedures: Your physician recommends that you have your pacemaker removed due to infection.  We will contact you with details for this procedure.  Follow-Up: To be determined once procedure is scheduled.  If you need a refill on your cardiac medications before your next appointment, please call your pharmacy.  Thank you for choosing [facility]!!   Dory HornSherri Smrithi Pigford, RN [phone]

## 2016-03-11 ENCOUNTER — Telehealth: Payer: Self-pay | Admitting: Cardiology

## 2016-03-11 NOTE — Telephone Encounter (Signed)
New Message:   She said she was waiting to hear from you about his surgery on Monday.She says she have some questions also.

## 2016-03-11 NOTE — Telephone Encounter (Signed)
Informed patient's dtr that we are still working on an OR room/date for next week. She understands I will call her tomorrow with more information/details.

## 2016-03-12 NOTE — Telephone Encounter (Signed)
Informed pt's dtr PPM explant scheduled for Wednesday 6/21.  Advised to [facility] at 1:00 pm for a 3:00 pm procedure. Instructed ok to have a light/clear liquid breakfast. NPO after 7am, but requested pt  to have breakfast around 6am if possible. Instructed that it is ok to take morning medications with sip of water excluding Lasix. Dtr asked if dad was going to need external PPM post explant -- discussed that according to Dr. Elberta Fortisamnitz pt is not dependent and therefore most likely will not need the external PPM.  But did review that due to his underlying low HRs it may be a possibility and that physicians will determine if needed and discuss with them. We also reviewed hospital stay and the different variables that may keep him there longer than overnight (ex: external PPM needed, IV abx needing to be infused), but she understands that they will review all of this if needed with them at the hospital. Dtr verbalized understanding and agreeable to plan.

## 2016-03-12 NOTE — Telephone Encounter (Signed)
Hospital informed us procedure starts at 2:30 pm. Called and advised dtr to have pt at hospital at 12:30 pm that day. She verbalized understanding and agreeable to plan.

## 2016-03-16 ENCOUNTER — Encounter (HOSPITAL_COMMUNITY): Payer: Self-pay | Admitting: *Deleted

## 2016-03-16 MED ORDER — SODIUM CHLORIDE 0.9 % IR SOLN
80.0000 mg | Status: AC
  Administered 2016-03-17: 80 mg
  Filled 2016-03-16: qty 2

## 2016-03-16 MED ORDER — CEFAZOLIN SODIUM-DEXTROSE 2-4 GM/100ML-% IV SOLN
2.0000 g | INTRAVENOUS | Status: AC
  Administered 2016-03-17: 2 g via INTRAVENOUS
  Filled 2016-03-16: qty 100

## 2016-03-16 NOTE — Progress Notes (Signed)
Pt SDW-pre-op call completed by pt daughter, Gary Leonard. Daughter stated that cardiac records were requested by Gary Leonard, Surgeon's nursing staff; a release of info was signed and cardiac records were requested from Dr. Sharrell KuPaul Leonard, Cardiology at [facility] in [place], [place] and [facility] [place]. Daughter stated that pt C/O SOB with exertion and " had to use one tablet of Nitroglycerin on on two different days last week."  Pt recent cardiology note was shown to Gary Leonard, Anesthesia, in addition to making him aware of the SOB and recent episodes of chest pain. Gary Leonard stated that if records are not received  by [facility], an echo, EKG and CMET must be done. Pt daughter stated that pt was advised by MD's staff that he can have a banana in the morning , he must be done eating it by 6:30 A.M. Daughter made aware that pt must stop taking Vitamins, fish oil, herbal medications and NSAID's. Daughter verbalized understanding of all pre-op instructions.

## 2016-03-17 ENCOUNTER — Ambulatory Visit (HOSPITAL_COMMUNITY): Payer: Medicare Other | Admitting: Certified Registered"

## 2016-03-17 ENCOUNTER — Ambulatory Visit (HOSPITAL_COMMUNITY): Payer: Medicare Other

## 2016-03-17 ENCOUNTER — Other Ambulatory Visit: Payer: Self-pay

## 2016-03-17 ENCOUNTER — Inpatient Hospital Stay (HOSPITAL_COMMUNITY)
Admission: RE | Admit: 2016-03-17 | Discharge: 2016-03-24 | DRG: 243 | Disposition: A | Discharge status: Home / Self Care | Payer: Medicare Other | Source: Ambulatory Visit | Attending: Internal Medicine | Admitting: Internal Medicine

## 2016-03-17 ENCOUNTER — Encounter (HOSPITAL_COMMUNITY): Payer: Self-pay | Admitting: Surgery

## 2016-03-17 ENCOUNTER — Encounter (HOSPITAL_COMMUNITY)
Admission: RE | Disposition: A | Discharge status: Home / Self Care | Payer: Self-pay | Source: Ambulatory Visit | Attending: Internal Medicine

## 2016-03-17 ENCOUNTER — Other Ambulatory Visit: Payer: Self-pay | Admitting: Internal Medicine

## 2016-03-17 DIAGNOSIS — F0151 Vascular dementia with behavioral disturbance: Secondary | ICD-10-CM | POA: Diagnosis not present

## 2016-03-17 DIAGNOSIS — Z96642 Presence of left artificial hip joint: Secondary | ICD-10-CM | POA: Diagnosis present

## 2016-03-17 DIAGNOSIS — E876 Hypokalemia: Secondary | ICD-10-CM | POA: Diagnosis present

## 2016-03-17 DIAGNOSIS — Z7901 Long term (current) use of anticoagulants: Secondary | ICD-10-CM | POA: Diagnosis not present

## 2016-03-17 DIAGNOSIS — I509 Heart failure, unspecified: Secondary | ICD-10-CM | POA: Diagnosis present

## 2016-03-17 DIAGNOSIS — Y831 Surgical operation with implant of artificial internal device as the cause of abnormal reaction of the patient, or of later complication, without mention of misadventure at the time of the procedure: Secondary | ICD-10-CM | POA: Diagnosis present

## 2016-03-17 DIAGNOSIS — T827XXA Infection and inflammatory reaction due to other cardiac and vascular devices, implants and grafts, initial encounter: Principal | ICD-10-CM | POA: Diagnosis present

## 2016-03-17 DIAGNOSIS — I2510 Atherosclerotic heart disease of native coronary artery without angina pectoris: Secondary | ICD-10-CM | POA: Diagnosis present

## 2016-03-17 DIAGNOSIS — I110 Hypertensive heart disease with heart failure: Secondary | ICD-10-CM | POA: Diagnosis present

## 2016-03-17 DIAGNOSIS — I252 Old myocardial infarction: Secondary | ICD-10-CM | POA: Diagnosis not present

## 2016-03-17 DIAGNOSIS — Z951 Presence of aortocoronary bypass graft: Secondary | ICD-10-CM | POA: Diagnosis not present

## 2016-03-17 DIAGNOSIS — F05 Delirium due to known physiological condition: Secondary | ICD-10-CM | POA: Diagnosis not present

## 2016-03-17 DIAGNOSIS — I442 Atrioventricular block, complete: Secondary | ICD-10-CM | POA: Diagnosis present

## 2016-03-17 DIAGNOSIS — I480 Paroxysmal atrial fibrillation: Secondary | ICD-10-CM | POA: Diagnosis present

## 2016-03-17 DIAGNOSIS — I4891 Unspecified atrial fibrillation: Secondary | ICD-10-CM | POA: Diagnosis not present

## 2016-03-17 DIAGNOSIS — I495 Sick sinus syndrome: Secondary | ICD-10-CM | POA: Diagnosis not present

## 2016-03-17 DIAGNOSIS — Z66 Do not resuscitate: Secondary | ICD-10-CM | POA: Diagnosis present

## 2016-03-17 DIAGNOSIS — Z87891 Personal history of nicotine dependence: Secondary | ICD-10-CM

## 2016-03-17 DIAGNOSIS — Z950 Presence of cardiac pacemaker: Secondary | ICD-10-CM

## 2016-03-17 DIAGNOSIS — F0150 Vascular dementia without behavioral disturbance: Secondary | ICD-10-CM | POA: Diagnosis present

## 2016-03-17 HISTORY — PX: PACEMAKER LEAD REMOVAL: SHX5064

## 2016-03-17 HISTORY — DX: Personal history of other medical treatment: Z92.89

## 2016-03-17 HISTORY — DX: Cerebral infarction, unspecified: I63.9

## 2016-03-17 HISTORY — PX: TEE WITHOUT CARDIOVERSION: SHX5443

## 2016-03-17 LAB — COMPREHENSIVE METABOLIC PANEL
ALBUMIN: 2.6 g/dL — AB (ref 3.5–5.0)
ALK PHOS: 68 U/L (ref 38–126)
ALT: 34 U/L (ref 17–63)
ANION GAP: 7 (ref 5–15)
AST: 58 U/L — ABNORMAL HIGH (ref 15–41)
BILIRUBIN TOTAL: 0.6 mg/dL (ref 0.3–1.2)
BUN: 20 mg/dL (ref 6–20)
CALCIUM: 8.7 mg/dL — AB (ref 8.9–10.3)
CO2: 24 mmol/L (ref 22–32)
Chloride: 105 mmol/L (ref 101–111)
Creatinine, Ser: 1.16 mg/dL (ref 0.61–1.24)
GFR calc Af Amer: >60 mL/min (ref >60)
GFR, EST NON AFRICAN AMERICAN: 56 mL/min — AB (ref >60)
GLUCOSE: 85 mg/dL (ref 65–99)
POTASSIUM: 4.3 mmol/L (ref 3.5–5.1)
Sodium: 136 mmol/L (ref 135–145)
Total Protein: 7 g/dL (ref 6.5–8.1)

## 2016-03-17 LAB — SURGICAL PCR SCREEN
MRSA, PCR: NEGATIVE
Staphylococcus aureus: NEGATIVE

## 2016-03-17 LAB — CBC
HEMATOCRIT: 28.2 % — AB (ref 39.0–52.0)
HEMOGLOBIN: 8.3 g/dL — AB (ref 13.0–17.0)
MCH: 24 pg — AB (ref 26.0–34.0)
MCHC: 29.4 g/dL — AB (ref 30.0–36.0)
MCV: 81.5 fL (ref 78.0–100.0)
Platelets: 257 10*3/uL (ref 150–400)
RBC: 3.46 MIL/uL — ABNORMAL LOW (ref 4.22–5.81)
RDW: 17.3 % — AB (ref 11.5–15.5)
WBC: 6.2 10*3/uL (ref 4.0–10.5)

## 2016-03-17 SURGERY — REMOVAL, ELECTRODE LEAD, CARDIAC PACEMAKER, WITHOUT REPLACEMENT
Anesthesia: General | Site: Chest

## 2016-03-17 MED ORDER — ONDANSETRON HCL 4 MG/2ML IJ SOLN: 4.0000 mg | Freq: Four times a day (QID) | INTRAMUSCULAR | Status: DC | PRN

## 2016-03-17 MED ORDER — SODIUM CHLORIDE 0.9 % IR SOLN
Status: AC
  Filled 2016-03-17: qty 2

## 2016-03-17 MED ORDER — ROCURONIUM BROMIDE 100 MG/10ML IV SOLN
INTRAVENOUS | Status: DC | PRN
  Administered 2016-03-17: 40 mg via INTRAVENOUS
  Administered 2016-03-17 (×2): 10 mg via INTRAVENOUS

## 2016-03-17 MED ORDER — PROPOFOL 10 MG/ML IV BOLUS
INTRAVENOUS | Status: AC
  Filled 2016-03-17: qty 20

## 2016-03-17 MED ORDER — SODIUM CHLORIDE 0.9 % IV SOLN
INTRAVENOUS | Status: DC
  Administered 2016-03-17: 14:00:00 via INTRAVENOUS

## 2016-03-17 MED ORDER — ESCITALOPRAM OXALATE 10 MG PO TABS
10.0000 mg | ORAL_TABLET | Freq: Every day | ORAL | Status: DC
  Administered 2016-03-17 – 2016-03-24 (×8): 10 mg via ORAL
  Filled 2016-03-17 (×8): qty 1

## 2016-03-17 MED ORDER — NEBIVOLOL HCL 5 MG PO TABS
10.0000 mg | ORAL_TABLET | Freq: Every day | ORAL | Status: DC
  Administered 2016-03-18 – 2016-03-24 (×7): 10 mg via ORAL
  Filled 2016-03-17 (×7): qty 2

## 2016-03-17 MED ORDER — MUPIROCIN 2 % EX OINT
TOPICAL_OINTMENT | CUTANEOUS | Status: AC
  Administered 2016-03-17: 14:00:00
  Filled 2016-03-17: qty 22

## 2016-03-17 MED ORDER — LIDOCAINE HCL (CARDIAC) 20 MG/ML IV SOLN
INTRAVENOUS | Status: DC | PRN
  Administered 2016-03-17: 30 mg via INTRAVENOUS

## 2016-03-17 MED ORDER — CEFAZOLIN IN D5W 1 GM/50ML IV SOLN
1.0000 g | Freq: Four times a day (QID) | INTRAVENOUS | Status: AC
  Administered 2016-03-17 – 2016-03-18 (×3): 1 g via INTRAVENOUS
  Filled 2016-03-17 (×3): qty 50

## 2016-03-17 MED ORDER — LORAZEPAM 1 MG PO TABS
1.0000 mg | ORAL_TABLET | Freq: Every evening | ORAL | Status: DC | PRN
Start: 2016-03-17 — End: 2016-03-24
  Administered 2016-03-20 – 2016-03-24 (×2): 1 mg via ORAL
  Filled 2016-03-17 (×2): qty 1

## 2016-03-17 MED ORDER — ROCURONIUM BROMIDE 50 MG/5ML IV SOLN
INTRAVENOUS | Status: AC
  Filled 2016-03-17: qty 1

## 2016-03-17 MED ORDER — RISPERIDONE 0.25 MG PO TABS
0.2500 mg | ORAL_TABLET | Freq: Every day | ORAL | Status: DC
  Administered 2016-03-17 – 2016-03-23 (×7): 0.25 mg via ORAL
  Filled 2016-03-17 (×8): qty 1

## 2016-03-17 MED ORDER — DONEPEZIL HCL 10 MG PO TABS
10.0000 mg | ORAL_TABLET | Freq: Every day | ORAL | Status: DC
  Administered 2016-03-17 – 2016-03-23 (×7): 10 mg via ORAL
  Filled 2016-03-17 (×7): qty 1

## 2016-03-17 MED ORDER — ISOSORBIDE MONONITRATE ER 60 MG PO TB24
120.0000 mg | ORAL_TABLET | ORAL | Status: DC
  Administered 2016-03-18 – 2016-03-24 (×7): 120 mg via ORAL
  Filled 2016-03-17 (×7): qty 2

## 2016-03-17 MED ORDER — SUGAMMADEX SODIUM 200 MG/2ML IV SOLN
INTRAVENOUS | Status: DC | PRN
  Administered 2016-03-17: 150 mg via INTRAVENOUS

## 2016-03-17 MED ORDER — ONDANSETRON HCL 4 MG/2ML IJ SOLN
INTRAMUSCULAR | Status: AC
  Filled 2016-03-17: qty 2

## 2016-03-17 MED ORDER — RAMIPRIL 2.5 MG PO CAPS
2.5000 mg | ORAL_CAPSULE | Freq: Two times a day (BID) | ORAL | Status: DC
  Administered 2016-03-17 – 2016-03-24 (×14): 2.5 mg via ORAL
  Filled 2016-03-17 (×14): qty 1

## 2016-03-17 MED ORDER — LIDOCAINE 2% (20 MG/ML) 5 ML SYRINGE
INTRAMUSCULAR | Status: AC
  Filled 2016-03-17: qty 5

## 2016-03-17 MED ORDER — PANTOPRAZOLE SODIUM 40 MG PO TBEC
40.0000 mg | DELAYED_RELEASE_TABLET | Freq: Every day | ORAL | Status: DC
  Administered 2016-03-18 – 2016-03-24 (×7): 40 mg via ORAL
  Filled 2016-03-17 (×7): qty 1

## 2016-03-17 MED ORDER — ONDANSETRON HCL 4 MG/2ML IJ SOLN
INTRAMUSCULAR | Status: DC | PRN
  Administered 2016-03-17: 4 mg via INTRAVENOUS

## 2016-03-17 MED ORDER — LIDOCAINE HCL (PF) 1 % IJ SOLN
INTRAMUSCULAR | Status: DC | PRN
  Administered 2016-03-17: 20 mL

## 2016-03-17 MED ORDER — SUGAMMADEX SODIUM 200 MG/2ML IV SOLN
INTRAVENOUS | Status: AC
  Filled 2016-03-17: qty 2

## 2016-03-17 MED ORDER — POTASSIUM CHLORIDE CRYS ER 10 MEQ PO TBCR
10.0000 meq | EXTENDED_RELEASE_TABLET | Freq: Every day | ORAL | Status: DC
  Administered 2016-03-18 – 2016-03-24 (×7): 10 meq via ORAL
  Filled 2016-03-17 (×7): qty 1

## 2016-03-17 MED ORDER — DOFETILIDE 250 MCG PO CAPS
500.0000 ug | ORAL_CAPSULE | Freq: Two times a day (BID) | ORAL | Status: DC
Start: 2016-03-17 — End: 2016-03-24
  Administered 2016-03-17 – 2016-03-24 (×14): 500 ug via ORAL
  Filled 2016-03-17 (×14): qty 2

## 2016-03-17 MED ORDER — CHLORHEXIDINE GLUCONATE 4 % EX LIQD: 60.0000 mL | Freq: Once | CUTANEOUS | Status: DC

## 2016-03-17 MED ORDER — PROPOFOL 10 MG/ML IV BOLUS
INTRAVENOUS | Status: DC | PRN
  Administered 2016-03-17: 130 mg via INTRAVENOUS

## 2016-03-17 MED ORDER — HEPARIN SODIUM (PORCINE) 5000 UNIT/ML IJ SOLN
5000.0000 [IU] | Freq: Three times a day (TID) | INTRAMUSCULAR | Status: DC
  Administered 2016-03-18 – 2016-03-24 (×16): 5000 [IU] via SUBCUTANEOUS
  Filled 2016-03-17 (×17): qty 1

## 2016-03-17 MED ORDER — ROSUVASTATIN CALCIUM 10 MG PO TABS
20.0000 mg | ORAL_TABLET | Freq: Every day | ORAL | Status: DC
  Administered 2016-03-18 – 2016-03-24 (×7): 20 mg via ORAL
  Filled 2016-03-17 (×8): qty 2

## 2016-03-17 MED ORDER — PHENYLEPHRINE HCL 10 MG/ML IJ SOLN
10.0000 mg | INTRAVENOUS | Status: DC | PRN
  Administered 2016-03-17: 20 ug/min via INTRAVENOUS

## 2016-03-17 MED ORDER — ACETAMINOPHEN 325 MG PO TABS
325.0000 mg | ORAL_TABLET | ORAL | Status: DC | PRN
Start: 2016-03-17 — End: 2016-03-23

## 2016-03-17 MED ORDER — FENTANYL CITRATE (PF) 250 MCG/5ML IJ SOLN
INTRAMUSCULAR | Status: DC | PRN
  Administered 2016-03-17 (×2): 50 ug via INTRAVENOUS

## 2016-03-17 MED ORDER — SODIUM CHLORIDE 0.9 % IV SOLN
INTRAVENOUS | Status: DC | PRN
  Administered 2016-03-17: 500 mL

## 2016-03-17 MED ORDER — FUROSEMIDE 40 MG PO TABS
60.0000 mg | ORAL_TABLET | Freq: Every day | ORAL | Status: DC
  Administered 2016-03-17 – 2016-03-24 (×7): 60 mg via ORAL
  Filled 2016-03-17 (×8): qty 1

## 2016-03-17 MED ORDER — LIDOCAINE HCL (PF) 1 % IJ SOLN
INTRAMUSCULAR | Status: AC
  Filled 2016-03-17: qty 30

## 2016-03-17 MED ORDER — FENTANYL CITRATE (PF) 250 MCG/5ML IJ SOLN
INTRAMUSCULAR | Status: AC
  Filled 2016-03-17: qty 5

## 2016-03-17 MED ORDER — NITROGLYCERIN 0.4 MG SL SUBL: 0.4000 mg | SUBLINGUAL_TABLET | SUBLINGUAL | Status: DC | PRN

## 2016-03-17 MED ORDER — LACTATED RINGERS IV SOLN
INTRAVENOUS | Status: DC
  Administered 2016-03-17: 15:00:00 via INTRAVENOUS

## 2016-03-17 MED ORDER — LACTATED RINGERS IV SOLN
INTRAVENOUS | Status: DC | PRN
  Administered 2016-03-17: 14:00:00 via INTRAVENOUS

## 2016-03-17 SURGICAL SUPPLY — 54 items
BAG BANDED W/RUBBER/TAPE 36X54 (MISCELLANEOUS) ×4 IMPLANT
BLADE 10 SAFETY STRL DISP (BLADE) ×4 IMPLANT
BLADE OSCILLATING /SAGITTAL (BLADE) IMPLANT
BLADE STERNUM SYSTEM 6 (BLADE) ×4 IMPLANT
BNDG COHESIVE 4X5 WHT NS (GAUZE/BANDAGES/DRESSINGS) IMPLANT
CANISTER SUCTION 2500CC (MISCELLANEOUS) ×4 IMPLANT
COIL ONE TIE COMPRESSION (MISCELLANEOUS) ×8 IMPLANT
CONT SPEC 4OZ CLIKSEAL STRL BL (MISCELLANEOUS) ×4 IMPLANT
COVER SURGICAL LIGHT HANDLE (MISCELLANEOUS) ×4 IMPLANT
COVER TABLE BACK 60X90 (DRAPES) ×4 IMPLANT
DRAPE CARDIOVASCULAR INCISE (DRAPES) ×2
DRAPE SRG 135X102X78XABS (DRAPES) ×2 IMPLANT
DRSG OPSITE 6X11 MED (GAUZE/BANDAGES/DRESSINGS) IMPLANT
DRSG TEGADERM 2-3/8X2-3/4 SM (GAUZE/BANDAGES/DRESSINGS) ×4 IMPLANT
ELECT REM PT RETURN 9FT ADLT (ELECTROSURGICAL) ×8
ELECTRODE REM PT RTRN 9FT ADLT (ELECTROSURGICAL) ×4 IMPLANT
GAUZE SPONGE 2X2 8PLY STRL LF (GAUZE/BANDAGES/DRESSINGS) ×2 IMPLANT
GAUZE SPONGE 4X4 12PLY STRL (GAUZE/BANDAGES/DRESSINGS) IMPLANT
GAUZE SPONGE 4X4 16PLY XRAY LF (GAUZE/BANDAGES/DRESSINGS) ×4 IMPLANT
GLOVE BIOGEL PI IND STRL 6.5 (GLOVE) ×4 IMPLANT
GLOVE BIOGEL PI IND STRL 7.0 (GLOVE) ×4 IMPLANT
GLOVE BIOGEL PI IND STRL 7.5 (GLOVE) ×2 IMPLANT
GLOVE BIOGEL PI INDICATOR 6.5 (GLOVE) ×4
GLOVE BIOGEL PI INDICATOR 7.0 (GLOVE) ×4
GLOVE BIOGEL PI INDICATOR 7.5 (GLOVE) ×2
GLOVE ECLIPSE 6.5 STRL STRAW (GLOVE) ×4 IMPLANT
GLOVE ECLIPSE 8.0 STRL XLNG CF (GLOVE) ×4 IMPLANT
GLOVE SURG SS PI 6.5 STRL IVOR (GLOVE) ×4 IMPLANT
GOWN STRL REUS W/ TWL LRG LVL3 (GOWN DISPOSABLE) IMPLANT
GOWN STRL REUS W/ TWL XL LVL3 (GOWN DISPOSABLE) ×4 IMPLANT
GOWN STRL REUS W/TWL LRG LVL3 (GOWN DISPOSABLE)
GOWN STRL REUS W/TWL XL LVL3 (GOWN DISPOSABLE) ×4
INGEVITY MRI 7741-52CM (Lead) ×4 IMPLANT
KIT ROOM TURNOVER OR (KITS) ×4 IMPLANT
LEAD PACING INGEVITY MRI 52CM (Lead) ×2 IMPLANT
NEEDLE PERC 18GX7CM (NEEDLE) ×4 IMPLANT
PAD ARMBOARD 7.5X6 YLW CONV (MISCELLANEOUS) ×8 IMPLANT
PAD ELECT DEFIB RADIOL ZOLL (MISCELLANEOUS) ×4 IMPLANT
SHEATH EVOLUTION RL 11F (SHEATH) ×4 IMPLANT
SHEATH EVOLUTION SHORTE RL 11F (SHEATH) ×4 IMPLANT
SHEATH PINNACLE 6F 10CM (SHEATH) ×4 IMPLANT
SPONGE GAUZE 2X2 STER 10/PKG (GAUZE/BANDAGES/DRESSINGS) ×2
STYLET LIBERATOR LOCKING (MISCELLANEOUS) ×8 IMPLANT
SUT PROLENE 2 0 MH 48 (SUTURE) ×4 IMPLANT
SUT PROLENE 2 0 SH DA (SUTURE) IMPLANT
SUT SILK  1 MH (SUTURE) ×2
SUT SILK 1 MH (SUTURE) ×2 IMPLANT
TAPE CLOTH SURG 4X10 WHT LF (GAUZE/BANDAGES/DRESSINGS) ×4 IMPLANT
TOWEL OR 17X24 6PK STRL BLUE (TOWEL DISPOSABLE) ×8 IMPLANT
TOWEL OR 17X26 10 PK STRL BLUE (TOWEL DISPOSABLE) ×8 IMPLANT
TRAY FOLEY IC TEMP SENS 16FR (CATHETERS) ×4 IMPLANT
TUBE CONNECTING 12'X1/4 (SUCTIONS)
TUBE CONNECTING 12X1/4 (SUCTIONS) IMPLANT
YANKAUER SUCT BULB TIP NO VENT (SUCTIONS) IMPLANT

## 2016-03-17 NOTE — H&P (Signed)
History of Present Illness: Gary Leonard is a 82 y.o. male who presents today for cardiology evaluation. Hx CAD (s/p CABG in 1979, 1988, 2002), chronic CHF (dx a few months ago), CVA in 1970s, Afib on Xarelto (for many years), has PPM, vascular dementia dx 01/2015 and HTN. Hospitalized in May with a fall. He was having SOB and CP prior to the fall but no LOC. He was having CP with exertion associated with dyspnea. He was requiring more NTG than usual. Was found to have a UGIB with a Hb of 4.7. Due to vascular dementia, no aggressive therapy was chosen. Received 4U PRBC. Xarelto and ASA were held. Since that time, his pacemaker site has started draining purulent fluid. According to his daughter, she is able to see the pacemaker through the wound. She says that they have been dressing the wound. He was initially followed in [place], and had his 3 cardiac surgeries at [facility].   Today, he denies symptoms of palpitations, chest pain, shortness of breath, orthopnea, PND, lower extremity edema, claudication, dizziness, presyncope, syncope, bleeding, or neurologic sequela. The patient is tolerating medications without difficulties and is otherwise without complaint today.    Past Medical History  Diagnosis Date  . Coronary artery disease   . Atrial fibrillation (HCC)   . Chronic CHF (congestive heart failure) (HCC)   . Vascular dementia   . Hypertension   . Complication of anesthesia     DIFFICULT WAKING UP   . Myocardial infarction (HCC) 1979  . Presence of permanent cardiac pacemaker   . GERD (gastroesophageal reflux disease)   . Arthritis   . GI bleed 01/2016   Past Surgical History  Procedure Laterality Date  . Cardiac surgery      open heart x 3  . Total hip arthroplasty Left   . Hernia repair    . Coronary artery bypass graft      1979 1988   . Total shoulder arthroplasty Left      Current Outpatient  Prescriptions  Medication Sig Dispense Refill  . dofetilide (TIKOSYN) 500 MCG capsule Take 500 mcg by mouth 2 (two) times daily.    Marland Kitchen donepezil (ARICEPT) 10 MG tablet Take 10 mg by mouth at bedtime.    Marland Kitchen escitalopram (LEXAPRO) 10 MG tablet Take 10 mg by mouth daily.    . furosemide (LASIX) 40 MG tablet Take 40 mg by mouth.    . isosorbide mononitrate (IMDUR) 120 MG 24 hr tablet Take 120 mg by mouth daily.    Marland Kitchen LORazepam (ATIVAN) 1 MG tablet Take 1 mg by mouth as directed.     . nebivolol (BYSTOLIC) 10 MG tablet Take 10 mg by mouth daily.    . nitroGLYCERIN (NITROSTAT) 0.4 MG SL tablet Place 0.4 mg under the tongue every 5 (five) minutes as needed for chest pain.    . pantoprazole (PROTONIX) 40 MG tablet Take 40 mg by mouth daily.    . potassium chloride (K-DUR) 10 MEQ tablet Take 10 mEq by mouth daily.    . ramipril (ALTACE) 2.5 MG capsule Take 2.5 mg by mouth 2 (two) times daily.    . risperiDONE (RISPERDAL) 0.25 MG tablet Take 0.25 mg by mouth at bedtime.    . rosuvastatin (CRESTOR) 20 MG tablet Take 20 mg by mouth daily.     No current facility-administered medications for this visit.    Allergies: Review of patient's allergies indicates no known allergies.   Social History: The patient  reports that he quit  smoking about 43 years ago. His smoking use included Cigarettes. He quit after 40 years of use. He has never used smokeless tobacco. He reports that he does not drink alcohol or use illicit drugs.   Family History: The patient's family history includes Heart disease in his sister.    ROS: Please see the history of pre, chset pain, leg swelling the device interrogated and is normal is sent illness. Otherwise, review of systems is positive for DOE. All other systems are reviewed and negative.    PHYSICAL EXAM: VS: BP 104/62 mmHg  Pulse 63  Ht 5\' 7"  (1.702 m)  Wt 163 lb 1.9 oz (73.991 kg)  BMI  25.54 kg/m2  SpO2 95% , BMI Body mass index is 25.54 kg/(m^2). GEN: Well nourished, well developed, in no acute distress  HEENT: normal  Neck: no JVD, carotid bruits, or masses Cardiac: Regular ; no murmurs, rubs, or gallops,no edema  Respiratory: clear to auscultation bilaterally, normal work of breathing GI: soft, nontender, nondistended, + BS MS: no deformity or atrophy  Skin: warm and dry, device pocket has a 3 mm hole with the device visible and purulent fluid visible and draining Neuro: Strength and sensation are intact Psych: euthymic mood, full affect  EKG: EKG is not ordered today.  Recent Labs: 01/29/2016: ALT 18 01/30/2016: BUN 36*; Creatinine, Ser 1.71*; Hemoglobin 7.7*; Hemoglobin 7.7*; Magnesium 2.0; Platelets 216; Platelets 214; Potassium 4.2; Sodium 136    Lipid Panel   Labs (Brief)    No results found for: CHOL, TRIG, HDL, CHOLHDL, VLDL, LDLCALC, LDLDIRECT     Wt Readings from Last 3 Encounters:  03/09/16 163 lb 1.9 oz (73.991 kg)  01/30/16 369 lb 14.9 oz (167.8 kg)      Other studies Reviewed: Additional studies/ records that were reviewed today include: Epic and PCP notes     ASSESSMENT AND PLAN:  1. 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 Extraction is therefore necessary. Fortunately he is not pacemaker dependent with sinus rhythm in the 30s at his baseline. We'll plan for extraction with the assistance of cardiac surgery. Discussed the procedure with both him and his daughter. Discussed the risks and benefits. He has had a sternotomy in the past, therefore the risks of the operation are quite a bit less as far as bleeding. Did tell him that there is a small risk of tearing of vein and significant bleeding. We'll likely have to place a semipermanent device post procedure. We'll work to schedule as soon as possible.    Current medicines are reviewed at length with the patient today.  The patient does not have  concerns regarding his medicines. The following changes were made today: none  Labs/ tests ordered today include:  Orders Placed This Encounter  Procedures  . Implantable device check         EP Attending  Patient seen and examined. Agree with above. I saw the patient with Dr. Elberta Fortisamnitz several days ago. He has an opening in his pocket and draining purulent material but he is not systemically ill. He has severe sinus node dysfunction and will need a temporary perm PM for several days prior to insertion of a new device.   Leonia ReevesGregg Kashvi Prevette,M.D.

## 2016-03-17 NOTE — OR Nursing (Addendum)
Pacemaker Device Model Number [id number] removed along with arterial lead model number 4469 and ventral lead model 4470

## 2016-03-17 NOTE — Progress Notes (Signed)
Pt has dementia but Alert to self oriented to place at present moment. Daughter at bedside and states pt is DNR she is heathcare POA. MD made aware, Dr. Allena KatzPatel, new order obtained.

## 2016-03-17 NOTE — Transfer of Care (Signed)
Immediate Anesthesia Transfer of Care Note  Patient: Gary Leonard  Procedure(s) Performed: Procedure(s): PACEMAKER LEAD REMOVAL (N/A) TRANSESOPHAGEAL ECHOCARDIOGRAM (TEE) (N/A)  Patient Location: PACU  Anesthesia Type:General  Level of Consciousness: awake  Airway & Oxygen Therapy: Patient Spontanous Breathing and Patient connected to nasal cannula oxygen  Post-op Assessment: Report given to RN  Post vital signs: Reviewed and stable  Last Vitals:  Filed Vitals:   03/17/16 1226  BP: 119/53  Pulse: 59  Temp: 36.4 C  Resp: 16    Last Pain: There were no vitals filed for this visit.    Patients Stated Pain Goal:  (unable to answer d/t dementia) (03/17/16 1341)  Complications: No apparent anesthesia complications

## 2016-03-17 NOTE — Anesthesia Procedure Notes (Signed)
Procedure Name: Intubation Date/Time: 03/17/2016 2:54 PM Performed by: Jefm MilesENNIE, Kenichi Cassada E Pre-anesthesia Checklist: Patient identified, Emergency Drugs available, Suction available, Patient being monitored and Timeout performed Patient Re-evaluated:Patient Re-evaluated prior to inductionOxygen Delivery Method: Circle system utilized Preoxygenation: Pre-oxygenation with 100% oxygen Intubation Type: IV induction Ventilation: Mask ventilation without difficulty Laryngoscope Size: Mac and 3 Grade View: Grade I Tube type: Oral Tube size: 8.0 mm Number of attempts: 1 Placement Confirmation: ETT inserted through vocal cords under direct vision,  positive ETCO2 and breath sounds checked- equal and bilateral Secured at: 21 cm Tube secured with: Tape Dental Injury: Teeth and Oropharynx as per pre-operative assessment

## 2016-03-17 NOTE — Anesthesia Preprocedure Evaluation (Addendum)
Anesthesia Evaluation  Patient identified by MRN, date of birth, ID band Patient awake    Reviewed: Allergy & Precautions, NPO status , Patient's Chart, lab work & pertinent test results  History of Anesthesia Complications (+) history of anesthetic complications  Airway Mallampati: II  TM Distance: >3 FB Neck ROM: Full    Dental  (+) Poor Dentition, Dental Advisory Given   Pulmonary former smoker,     + decreased breath sounds      Cardiovascular hypertension, + CAD, + Past MI and + CABG  + dysrhythmias + pacemaker  Rhythm:Irregular Rate:Normal     Neuro/Psych PSYCHIATRIC DISORDERS CVA    GI/Hepatic GERD  ,  Endo/Other    Renal/GU Renal disease     Musculoskeletal  (+) Arthritis ,   Abdominal   Peds  Hematology  (+) anemia ,   Anesthesia Other Findings   Reproductive/Obstetrics                            Anesthesia Physical Anesthesia Plan  ASA: IV  Anesthesia Plan: General   Post-op Pain Management:    Induction: Intravenous  Airway Management Planned: Oral ETT  Additional Equipment: Arterial line and TEE  Intra-op Plan:   Post-operative Plan: Possible Post-op intubation/ventilation  Informed Consent: I have reviewed the patients History and Physical, chart, labs and discussed the procedure including the risks, benefits and alternatives for the proposed anesthesia with the patient or authorized representative who has indicated his/her understanding and acceptance.   Dental advisory given  Plan Discussed with:   Anesthesia Plan Comments:         Anesthesia Quick Evaluation

## 2016-03-17 NOTE — Progress Notes (Signed)
Orthopedic Tech Progress Note Patient Details:  Gary Leonard 10/07/1930 [id number]  Ortho Devices Type of Ortho Device: Arm sling Ortho Device/Splint Interventions: Application   Gary Leonard 03/17/2016, 6:34 PM

## 2016-03-17 NOTE — Progress Notes (Signed)
  Echocardiogram Echocardiogram Transesophageal has been performed.  Leta JunglingCooper, Carisma Troupe M 03/17/2016, 3:34 PM

## 2016-03-17 NOTE — Op Note (Signed)
Electrophysiology procedure note  Procedure performed: Extraction of a dual-chamber pacing system and insertion of a temporary permanent transvenous pacing system.  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 The anesthesia service was utilized to provide invasive arterial monitoring and general anesthesia. A 6 French sheath was inserted percutaneously into the right femoral vein for additional IV access. Transesophageal echocardiography was provided. After the usual preparation draping, the left jugular vein was punctured and a active fixation [employer] pacing lead was inserted percutaneously through the right jugular vein and under fluoroscopic guidance into the right ventricle where was actively fixed. The R waves measured 12 mV. The pacing threshold was 1 V at 0.5 ms. With the satisfactory parameters, the lead was secured with silk suture.  7 cm incision was then made over the old pacemaker insertion site. Electrocautery was utilized to dissect down to the pacemaker pocket which was purulent. The generator was removed and disconnected from the atrial and ventricular pacing leads. Electrocautery was utilized to free up the scar tissue. The suture sleeves were removed. Stylets were inserted into both atrial and ventricular lead, and attempts to retract the helix and body of the lead were carried out. At this point the stylets were removed and a Cook Liberator locking stylette was inserted into both atrial and ventricular pacing leads after the leads were cut. The locking stylettes were fixed in place. At this point, a Cook 11 JamaicaFrench LR short dissection sheath was advanced over the ventricular lead and into the central circulation. The short dissection sheath was removed and the long  dissection sheath which was also a Cook 11 French LR sheath was advanced over the lead but could not get past the binding site just beyond the transition of the left subclavian vein to the innominate vein. The tip of the lead however did become free and retracted back into the right atrium. Attention was then turned to the atrial lead. Again the [facility] CenterCook 11 [place] LR short dissection sheath was advanced over the atrial lead to the junction of the subclavian and innominate vein. The short dissection sheath was removed and the long dissection sheath advanced into the and over the atrial lead. Gentle traction was placed on the atrial and ventricular leads. The dissection sheath could not traverse the binding site at the junction that was previously mentioned. It appeared the leads were bound together. At this point gentle traction was placed on both leads simultaneously and they were removed in total without hemodynamic sequelae. Pressure was held and hemostasis was obtained. The pocket was irrigated and debridement. The incision was closed with multiple Prolene mattress sutures. At this point the hip for a permanent pacing lead was connected to the patient's previously utilize dual-chamber pacemaker generator and secured with silk suture. Op sites were placed. The right femoral venous sheath was removed and hemostasis was assured with gentle pressure. The patient was prepared for extubation and returned to the recovery area. His temporary pacemaker was programmed VVI 60.   Complications: There were no immediate procedure complications  Estimated blood loss: Less than 50 cc  Conclusion: Successful extraction of an infected dual-chamber pacemaker system and insertion of a new temporary permanent single-chamber pacemaker. After a period of antibiotic therapy, the patient will be referred for a new contralateral dual-chamber pacing system as he has no reliable  escape rhythm.  Lewayne Bunting, M.D.

## 2016-03-18 ENCOUNTER — Encounter (HOSPITAL_COMMUNITY): Payer: Self-pay | Admitting: Internal Medicine

## 2016-03-18 DIAGNOSIS — F0151 Vascular dementia with behavioral disturbance: Secondary | ICD-10-CM

## 2016-03-18 DIAGNOSIS — I480 Paroxysmal atrial fibrillation: Secondary | ICD-10-CM

## 2016-03-18 DIAGNOSIS — F05 Delirium due to known physiological condition: Secondary | ICD-10-CM

## 2016-03-18 LAB — MAGNESIUM: MAGNESIUM: 2.1 mg/dL (ref 1.7–2.4)

## 2016-03-18 MED ORDER — LORAZEPAM 2 MG/ML IJ SOLN
0.5000 mg | Freq: Once | INTRAMUSCULAR | Status: AC
  Administered 2016-03-18: 0.5 mg via INTRAVENOUS
  Filled 2016-03-18: qty 1

## 2016-03-18 MED ORDER — HALOPERIDOL LACTATE 5 MG/ML IJ SOLN: 0.5000 mg | Freq: Four times a day (QID) | INTRAMUSCULAR | Status: DC | PRN

## 2016-03-18 MED ORDER — VANCOMYCIN HCL IN DEXTROSE 1-5 GM/200ML-% IV SOLN
1000.0000 mg | Freq: Two times a day (BID) | INTRAVENOUS | Status: DC
  Filled 2016-03-18 (×2): qty 200

## 2016-03-18 MED ORDER — VANCOMYCIN HCL IN DEXTROSE 1-5 GM/200ML-% IV SOLN
1000.0000 mg | INTRAVENOUS | Status: DC
  Administered 2016-03-18 – 2016-03-24 (×7): 1000 mg via INTRAVENOUS
  Filled 2016-03-18 (×8): qty 200

## 2016-03-18 NOTE — Progress Notes (Signed)
Utilization review completed.  

## 2016-03-18 NOTE — Progress Notes (Signed)
Patient stumbled over threshhold to bathroom. Right forearm skin tear at previous hematoma site.  Not actively bleeding.  Applied pink foam dressing to site and notified CN RN and patient daughter.  Both patient and daughter state this happens frequently due to thin skin and blood thinners.  Pt resting with call bell within reach.  Will continue to monitor. Thomas HoffBurton, Ebonee Stober McClintock, RN

## 2016-03-18 NOTE — Progress Notes (Signed)
SUBJECTIVE: The patient is doing well today.  At this time, he denies chest pain, shortness of breath, or any new concerns.  Marland Kitchen.  ceFAZolin (ANCEF) IV  1 g Intravenous Q6H  . dofetilide  500 mcg Oral BID  . donepezil  10 mg Oral QHS  . escitalopram  10 mg Oral Daily  . furosemide  60 mg Oral Daily  . heparin subcutaneous  5,000 Units Subcutaneous Q8H  . isosorbide mononitrate  120 mg Oral BH-q7a  . nebivolol  10 mg Oral Daily  . pantoprazole  40 mg Oral Daily  . potassium chloride  10 mEq Oral Daily  . ramipril  2.5 mg Oral BID  . risperiDONE  0.25 mg Oral QHS  . rosuvastatin  20 mg Oral Daily      OBJECTIVE: Physical Exam: Filed Vitals:   03/18/16 0100 03/18/16 0200 03/18/16 0400 03/18/16 0600  BP: 139/51 110/94 97/49 114/48  Pulse: 62 60 59 65  Temp:      TempSrc:      Resp: 18 16 16 18   Height:      Weight:      SpO2: 100% 100% 100% 100%    Intake/Output Summary (Last 24 hours) at 03/18/16 0838 Last data filed at 03/18/16 0700  Gross per 24 hour  Intake    870 ml  Output   3275 ml  Net  -2405 ml    Telemetry reveals sinus rhythm, intermittent pacing  GEN- The patient is well appearing, oriented to self, daughter.   Head- normocephalic, atraumatic Eyes-  Sclera clear, conjunctiva pink Ears- hearing intact Oropharynx- clear Neck- supple, no JVP Lungs- Clear to ausculation bilaterally, normal work of breathing Heart- Regular rate and rhythm, no significant murmurs, no rubs or gallops GI- soft, NT, ND Extremities- no clubbing, cyanosis, or edema Skin- no rash or lesion Psych- euthymic mood, full affect Neuro- no gross physical deficits appreciated  PPM explant site is dry, sutures in place, no hematoma Temp-perm site, right IJ, stable, no bleeding.  LABS: Basic Metabolic Panel:  Recent Labs  16/06/9605/21/17 1247  NA 136  K 4.3  CL 105  CO2 24  GLUCOSE 85  BUN 20  CREATININE 1.16  CALCIUM 8.7*   Liver Function Tests:  Recent Labs   03/17/16 1247  AST 58*  ALT 34  ALKPHOS 68  BILITOT 0.6  PROT 7.0  ALBUMIN 2.6*   No results for input(s): LIPASE, AMYLASE in the last 72 hours. CBC:  Recent Labs  03/17/16 1247  WBC 6.2  HGB 8.3*  HCT 28.2*  MCV 81.5  PLT 257    ASSESSMENT AND PLAN:   1. PPM infection     S/p PPM extraction yesterday by Dr. Idolina Primeraylor/Cray Monnin     CXR is pending     Wounds are stable, and redressed by Dr. Elberta Fortisamnitz this morning     Plans for new implant next week  2. Vascular dimentia      More agitation overnight, cooperative this  Morning     Follow  3. PAFib     CHA2DS2Vasc is at least 7     taken off a/c in May after GIB requiring multiple transfusions (no GI w/u was pursued given advanced age and rapidly advancing dementia)     On Tikosyn, EKG is paced     K+ 4.3, Creat 1.16 (Calc Cr. Cl 50.15) follow     Check mag   4. CAD     No anginal c/o  No antiplatelet given GIB hx  5. HTN     Stable  Patient carries DNR status  Francis DowseRenee Ursuy, Cordelia Poche-C 03/18/2016 8:38 AM   I have seen and examined this patient with Francis Dowseenee Ursuy.  Agree with above, note added to reflect my findings.  On exam, regular rhythm, no murmurs, lungs clear.  Had dual chamber pacemaker explant due to infection of the pocket. Cultures drawn from tip of wire.  Patient with delirium overnight with mits on hands.  Mustafa Potts plan for conservative measures for delirium.  On tikosyn so Tracie Lindbloom try to avoid antipsychotics.  Hadessah Grennan start on vancomycin currently for pocket infection.      Daun Rens M. Deairra Halleck MD 03/18/2016 9:09 AM

## 2016-03-18 NOTE — Progress Notes (Addendum)
Pharmacy Antibiotic Note  Gary Leonard is a 82 y.o. male admitted on 03/17/2016 with infected pacemaker pocket s/p removal and temp placed. Pharmacy has been consulted for vancomycin dosing.  No fevers noted, wbc normal. Patient received ancef for the past 24 hours for post surgical prophylaxis, now starting vancomycin for likely pocket infection. Cultures done in cath lab yesterday.   Scr normal but given age will reduce vancomycin dose accordingly.  Plan: Vancomycin 1000mg   IV every 24 hours.  Goal trough 10-15 mcg/mL.  Height: 5\' 7"  (170.2 cm) Weight: 162 lb 14.7 oz (73.9 kg) IBW/kg (Calculated) : 66.1  Temp (24hrs), Avg:97.8 F (36.6 C), Min:97.6 F (36.4 C), Max:97.9 F (36.6 C)   Recent Labs Lab 03/17/16 1247  WBC 6.2  CREATININE 1.16    Estimated Creatinine Clearance: 45.1 mL/min (by C-G formula based on Cr of 1.16).    No Known Allergies  Antimicrobials this admission: Ancef 6/21 >> 6/22 Vanc 6/22 >>   Dose adjustments this admission: n/a  Microbiology results: 6/21 wound cx:  Thank you for allowing pharmacy to be a part of this patient's care.  Sheppard CoilFrank Addie Cederberg PharmD., BCPS Clinical Pharmacist Pager [phone] 03/18/2016 9:55 AM

## 2016-03-18 NOTE — Plan of Care (Signed)
Problem: Safety: Goal: Ability to remain free from injury will improve Outcome: Progressing Continues to need safety sitter.  Pt has been able to get more rest today.

## 2016-03-18 NOTE — Anesthesia Postprocedure Evaluation (Signed)
Anesthesia Post Note  Patient: Gary Leonard  Procedure(s) Performed: Procedure(s) (LRB): PACEMAKER LEAD REMOVAL (N/A) TRANSESOPHAGEAL ECHOCARDIOGRAM (TEE) (N/A)  Patient location during evaluation: PACU Anesthesia Type: General Level of consciousness: awake and alert Pain management: pain level controlled Vital Signs Assessment: post-procedure vital signs reviewed and stable Respiratory status: spontaneous breathing, nonlabored ventilation, respiratory function stable and patient connected to nasal cannula oxygen Cardiovascular status: blood pressure returned to baseline and stable Postop Assessment: no signs of nausea or vomiting Anesthetic complications: no    Last Vitals:  Filed Vitals:   03/18/16 0400 03/18/16 0600  BP: 97/49 114/48  Pulse: 59 65  Temp:    Resp: 16 18    Last Pain: There were no vitals filed for this visit.               Reino KentJudd, Cherisa Brucker J

## 2016-03-18 NOTE — Progress Notes (Signed)
Pt has been uncooperative with leaving left arm in sling and not using left arm. Pt has repeatedly been reminded to leave arm in sling and not move left arm. Pt is confused and is very forgetful. Daughter at bedside. IV ativan given to help calm pt when he got combative with nursing staff. See MAR will continue to monitor.

## 2016-03-19 NOTE — Care Management Important Message (Signed)
Important Message  Patient Details  Name: Gary Leonard MRN: [id number] Date of Birth: 02/07/1931   Medicare Important Message Given:  Yes    Gary Leonard 03/19/2016, 10:31 AM

## 2016-03-19 NOTE — Progress Notes (Signed)
Dr Elberta Fortisamnitz saw and examined patient this morning, plan for PPM re-implant next week with Dr Ladona Ridgelaylor.  Scheduled for Tuesday. Orders not yet written.  Please see Dr Elberta Fortisamnitz' rounding note from today for further plans.  Gypsy BalsamAmber Seiler, NP 03/19/2016 6:36 PM

## 2016-03-20 DIAGNOSIS — T827XXD Infection and inflammatory reaction due to other cardiac and vascular devices, implants and grafts, subsequent encounter: Secondary | ICD-10-CM

## 2016-03-20 LAB — BASIC METABOLIC PANEL
Anion gap: 7 (ref 5–15)
BUN: 23 mg/dL — AB (ref 6–20)
CHLORIDE: 97 mmol/L — AB (ref 101–111)
CO2: 29 mmol/L (ref 22–32)
CREATININE: 1.34 mg/dL — AB (ref 0.61–1.24)
Calcium: 8.3 mg/dL — ABNORMAL LOW (ref 8.9–10.3)
GFR calc Af Amer: 55 mL/min — ABNORMAL LOW (ref >60)
GFR calc non Af Amer: 47 mL/min — ABNORMAL LOW (ref >60)
GLUCOSE: 86 mg/dL (ref 65–99)
Potassium: 3.6 mmol/L (ref 3.5–5.1)
Sodium: 133 mmol/L — ABNORMAL LOW (ref 135–145)

## 2016-03-20 LAB — CBC
HEMATOCRIT: 25.9 % — AB (ref 39.0–52.0)
HEMOGLOBIN: 7.7 g/dL — AB (ref 13.0–17.0)
MCH: 23.5 pg — AB (ref 26.0–34.0)
MCHC: 29.7 g/dL — AB (ref 30.0–36.0)
MCV: 79.2 fL (ref 78.0–100.0)
Platelets: 220 10*3/uL (ref 150–400)
RBC: 3.27 MIL/uL — ABNORMAL LOW (ref 4.22–5.81)
RDW: 17.4 % — AB (ref 11.5–15.5)
WBC: 7.3 10*3/uL (ref 4.0–10.5)

## 2016-03-20 NOTE — Progress Notes (Signed)
Patient ID: Gary Leonard Kress, male   DOB: 10/31/1930, 82 y.o.   MRN: [id number]    Patient Name: Gary Leonard Roosevelt Date of Encounter: 03/20/2016     Active Problems:   Pacemaker electrode infection (HCC)    SUBJECTIVE  No chest pain or sob. Confusion is improved.   CURRENT MEDS . dofetilide  500 mcg Oral BID  . donepezil  10 mg Oral QHS  . escitalopram  10 mg Oral Daily  . furosemide  60 mg Oral Daily  . heparin subcutaneous  5,000 Units Subcutaneous Q8H  . isosorbide mononitrate  120 mg Oral BH-q7a  . nebivolol  10 mg Oral Daily  . pantoprazole  40 mg Oral Daily  . potassium chloride  10 mEq Oral Daily  . ramipril  2.5 mg Oral BID  . risperiDONE  0.25 mg Oral QHS  . rosuvastatin  20 mg Oral Daily  . vancomycin  1,000 mg Intravenous Q24H    OBJECTIVE  Filed Vitals:   03/19/16 0322 03/19/16 1535 03/19/16 2116 03/20/16 0457  BP: 117/58 124/59 138/85 111/48  Pulse: 59 60 59 58  Temp: 97.7 F (36.5 C) 97.7 F (36.5 C) 98.3 F (36.8 C) 98.4 F (36.9 C)  TempSrc: Oral Oral Oral Oral  Resp: 20 19 16 18   Height:      Weight:    166 lb 0.1 oz (75.3 kg)  SpO2: 94% 98% 98% 92%    Intake/Output Summary (Last 24 hours) at 03/20/16 0855 Last data filed at 03/19/16 2230  Gross per 24 hour  Intake    460 ml  Output      0 ml  Net    460 ml   Filed Weights   03/17/16 1226 03/17/16 1947 03/20/16 0457  Weight: 163 lb (73.936 kg) 162 lb 14.7 oz (73.9 kg) 166 lb 0.1 oz (75.3 kg)    PHYSICAL EXAM  General: Pleasant, NAD. Neuro: Alert and oriented X 3. Moves all extremities spontaneously. Psych: Normal affect. HEENT:  Normal  Neck: Supple without bruits or JVD. Lungs:  Resp regular and unlabored, CTA. Left chest wound is clean and dry. Heart: RRR no s3, s4, or murmurs. Abdomen: Soft, non-tender, non-distended, BS + x 4.  Extremities: No clubbing, cyanosis or edema. DP/PT/Radials 2+ and equal bilaterally.  Accessory Clinical Findings  CBC  Recent Labs  03/17/16 1247  03/20/16 0349  WBC 6.2 7.3  HGB 8.3* 7.7*  HCT 28.2* 25.9*  MCV 81.5 79.2  PLT 257 220   Basic Metabolic Panel  Recent Labs  03/17/16 1247 03/18/16 0920 03/20/16 0349  NA 136  --  133*  K 4.3  --  3.6  CL 105  --  97*  CO2 24  --  29  GLUCOSE 85  --  86  BUN 20  --  23*  CREATININE 1.16  --  1.34*  CALCIUM 8.7*  --  8.3*  MG  --  2.1  --    Liver Function Tests  Recent Labs  03/17/16 1247  AST 58*  ALT 34  ALKPHOS 68  BILITOT 0.6  PROT 7.0  ALBUMIN 2.6*   No results for input(s): LIPASE, AMYLASE in the last 72 hours. Cardiac Enzymes No results for input(s): CKTOTAL, CKMB, CKMBINDEX, TROPONINI in the last 72 hours. BNP Invalid input(s): POCBNP D-Dimer No results for input(s): DDIMER in the last 72 hours. Hemoglobin A1C No results for input(s): HGBA1C in the last 72 hours. Fasting Lipid Panel No results for input(s): CHOL, HDL, LDLCALC,  TRIG, CHOLHDL, LDLDIRECT in the last 72 hours. Thyroid Function Tests No results for input(s): TSH, T4TOTAL, T3FREE, THYROIDAB in the last 72 hours.  Invalid input(s): FREET3  TELE  NSR with ventricular pacing.  Radiology/Studies  No results found.  ASSESSMENT AND PLAN  1. 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 No evidence of systemic infection at this time. Continue anti-biotics. 2. Complete heart block - he is stable s/p temp perm PM 3. CAD - he is s/p remote bypass and has no angina 4. Atrial fib - he remains stable. Continue Tikosyn.  Donivin Wirt,M.D.  03/20/2016 8:55 AM

## 2016-03-21 LAB — BASIC METABOLIC PANEL
ANION GAP: 8 (ref 5–15)
BUN: 24 mg/dL — AB (ref 6–20)
CO2: 28 mmol/L (ref 22–32)
Calcium: 8.5 mg/dL — ABNORMAL LOW (ref 8.9–10.3)
Chloride: 96 mmol/L — ABNORMAL LOW (ref 101–111)
Creatinine, Ser: 1.31 mg/dL — ABNORMAL HIGH (ref 0.61–1.24)
GFR calc Af Amer: 56 mL/min — ABNORMAL LOW (ref >60)
GFR, EST NON AFRICAN AMERICAN: 49 mL/min — AB (ref >60)
Glucose, Bld: 92 mg/dL (ref 65–99)
POTASSIUM: 3.4 mmol/L — AB (ref 3.5–5.1)
SODIUM: 132 mmol/L — AB (ref 135–145)

## 2016-03-21 LAB — TYPE AND SCREEN
ABO/RH(D): A POS
ANTIBODY SCREEN: NEGATIVE
UNIT DIVISION: 0
Unit division: 0

## 2016-03-21 LAB — VANCOMYCIN, TROUGH: Vancomycin Tr: 12 ug/mL (ref 10.0–20.0)

## 2016-03-21 LAB — MAGNESIUM: MAGNESIUM: 2.1 mg/dL (ref 1.7–2.4)

## 2016-03-21 MED ORDER — POTASSIUM CHLORIDE CRYS ER 20 MEQ PO TBCR
40.0000 meq | EXTENDED_RELEASE_TABLET | Freq: Once | ORAL | Status: AC
  Administered 2016-03-21: 40 meq via ORAL
  Filled 2016-03-21: qty 2

## 2016-03-21 NOTE — Progress Notes (Signed)
Patient ID: Gary Leonard, male   DOB: 11/15/1930, 82 y.o.   MRN: [id number]    Patient Name: Gary KidneyMitchell Leonard Date of Encounter: 03/21/2016     Active Problems:   Pacemaker electrode infection (HCC)    SUBJECTIVE  Denies chest pain or sob  CURRENT MEDS . dofetilide  500 mcg Oral BID  . donepezil  10 mg Oral QHS  . escitalopram  10 mg Oral Daily  . furosemide  60 mg Oral Daily  . heparin subcutaneous  5,000 Units Subcutaneous Q8H  . isosorbide mononitrate  120 mg Oral BH-q7a  . nebivolol  10 mg Oral Daily  . pantoprazole  40 mg Oral Daily  . potassium chloride  10 mEq Oral Daily  . ramipril  2.5 mg Oral BID  . risperiDONE  0.25 mg Oral QHS  . rosuvastatin  20 mg Oral Daily  . vancomycin  1,000 mg Intravenous Q24H    OBJECTIVE  Filed Vitals:   03/20/16 1047 03/20/16 1402 03/20/16 2039 03/21/16 0602  BP: 116/56 119/58 132/60 142/57  Pulse:  59 59 59  Temp:  97.9 F (36.6 C) 98 F (36.7 C) 98.3 F (36.8 C)  TempSrc:  Oral Oral Oral  Resp:  18 16 18   Height:      Weight:      SpO2:  97% 99% 96%    Intake/Output Summary (Last 24 hours) at 03/21/16 0816 Last data filed at 03/21/16 0200  Gross per 24 hour  Intake    970 ml  Output   1300 ml  Net   -330 ml   Filed Weights   03/17/16 1226 03/17/16 1947 03/20/16 0457  Weight: 163 lb (73.936 kg) 162 lb 14.7 oz (73.9 kg) 166 lb 0.1 oz (75.3 kg)    PHYSICAL EXAM  General: Pleasant, elderly man, NAD. Neuro: Alert and oriented X 3. Moves all extremities spontaneously. Psych: Normal affect. HEENT:  Normal  Neck: Supple without bruits or JVD. Lungs:  Resp regular and unlabored, CTA. Left chest incision is healing well. Temp-perm PM lead in place. Heart: RRR no s3, s4, or murmurs. Abdomen: Soft, non-tender, non-distended, BS + x 4.  Extremities: No clubbing, cyanosis or edema. DP/PT/Radials 2+ and equal bilaterally.  Accessory Clinical Findings  CBC  Recent Labs  03/20/16 0349  WBC 7.3  HGB 7.7*  HCT 25.9*    MCV 79.2  PLT 220   Basic Metabolic Panel  Recent Labs  03/18/16 0920 03/20/16 0349 03/21/16 0218  NA  --  133* 132*  K  --  3.6 3.4*  CL  --  97* 96*  CO2  --  29 28  GLUCOSE  --  86 92  BUN  --  23* 24*  CREATININE  --  1.34* 1.31*  CALCIUM  --  8.3* 8.5*  MG 2.1  --  2.1   Liver Function Tests No results for input(s): AST, ALT, ALKPHOS, BILITOT, PROT, ALBUMIN in the last 72 hours. No results for input(s): LIPASE, AMYLASE in the last 72 hours. Cardiac Enzymes No results for input(s): CKTOTAL, CKMB, CKMBINDEX, TROPONINI in the last 72 hours. BNP Invalid input(s): POCBNP D-Dimer No results for input(s): DDIMER in the last 72 hours. Hemoglobin A1C No results for input(s): HGBA1C in the last 72 hours. Fasting Lipid Panel No results for input(s): CHOL, HDL, LDLCALC, TRIG, CHOLHDL, LDLDIRECT in the last 72 hours. Thyroid Function Tests No results for input(s): TSH, T4TOTAL, T3FREE, THYROIDAB in the last 72 hours.  Invalid input(s): FREET3  TELE  Ventricular pacing. Cannot discern atrial rhythm  Radiology/Studies  No results found.  ASSESSMENT AND PLAN  1. Sinus node dysfunction/[facility] dependence [facility] 2. PM pocket infection after trauma - he is s/p extraction and healing well. Cultures are negative and no evidence of systemic infection. Continue IV anti-biotics and switch to oral at discharge. 3. CAD - he denies anginal symptoms.  Gregg Taylor,M.D.  03/21/2016 8:16 AM

## 2016-03-21 NOTE — Progress Notes (Signed)
Pharmacy Antibiotic Note  Gary Leonard is a 82 y.o. male admitted on 03/17/2016 with infected pacemaker pocket s/p removal and temp placed. Pharmacy has been consulted for vancomycin dosing.  No fevers noted, wbc normal. Patient received ancef for post surgical prophylaxis, then switched to vancomycin for pocket infection. Cultures done in cath lab have been no growth.  Vancomycin level at goal. Re-implant Tuesday.  Scr did bump to 1.3 but appear dose continues to be appropriate.  Plan: Continue Vancomycin 1000mg   IV every 24 hours.  Goal trough 10-15 mcg/mL.  Height: 5\' 7"  (170.2 cm) Weight: 166 lb 0.1 oz (75.3 kg) IBW/kg (Calculated) : 66.1  Temp (24hrs), Avg:98.1 F (36.7 C), Min:97.9 F (36.6 C), Max:98.3 F (36.8 C)   Recent Labs Lab 03/17/16 1247 03/20/16 0349 03/21/16 0218 03/21/16 0920  WBC 6.2 7.3  --   --   CREATININE 1.16 1.34* 1.31*  --   VANCOTROUGH  --   --   --  12    Estimated Creatinine Clearance: 39.9 mL/min (by C-G formula based on Cr of 1.31).    No Known Allergies  Antimicrobials this admission: Ancef 6/21 >> 6/22 Vanc 6/22 >>   Dose adjustments this admission: 6/25 VT 12>>no changes  Microbiology results: 6/21 wound [id number]  Thank you for allowing pharmacy to be a part of this patient's care.  Sheppard CoilFrank Wilson PharmD., BCPS Clinical Pharmacist Pager [phone] 03/21/2016 12:01 PM

## 2016-03-22 LAB — BASIC METABOLIC PANEL
Anion gap: 6 (ref 5–15)
BUN: 20 mg/dL (ref 6–20)
CALCIUM: 8.5 mg/dL — AB (ref 8.9–10.3)
CO2: 28 mmol/L (ref 22–32)
CREATININE: 1.28 mg/dL — AB (ref 0.61–1.24)
Chloride: 99 mmol/L — ABNORMAL LOW (ref 101–111)
GFR, EST AFRICAN AMERICAN: 58 mL/min — AB (ref >60)
GFR, EST NON AFRICAN AMERICAN: 50 mL/min — AB (ref >60)
Glucose, Bld: 97 mg/dL (ref 65–99)
Potassium: 4 mmol/L (ref 3.5–5.1)
SODIUM: 133 mmol/L — AB (ref 135–145)

## 2016-03-22 LAB — AEROBIC/ANAEROBIC CULTURE (SURGICAL/DEEP WOUND)

## 2016-03-22 LAB — AEROBIC/ANAEROBIC CULTURE W GRAM STAIN (SURGICAL/DEEP WOUND): Culture: NO GROWTH

## 2016-03-22 MED ORDER — SODIUM CHLORIDE 0.9% FLUSH: 3.0000 mL | Freq: Two times a day (BID) | INTRAVENOUS | Status: DC

## 2016-03-22 MED ORDER — SODIUM CHLORIDE 0.9% FLUSH: 3.0000 mL | INTRAVENOUS | Status: DC | PRN

## 2016-03-22 MED ORDER — SODIUM CHLORIDE 0.9 % IV SOLN: 250.0000 mL | INTRAVENOUS | Status: DC

## 2016-03-22 MED ORDER — CHLORHEXIDINE GLUCONATE 4 % EX LIQD
60.0000 mL | Freq: Once | CUTANEOUS | Status: AC
  Administered 2016-03-23: 4 via TOPICAL
  Filled 2016-03-22: qty 60

## 2016-03-22 MED ORDER — SODIUM CHLORIDE 0.9 % IV SOLN: INTRAVENOUS | Status: DC

## 2016-03-22 MED ORDER — CHLORHEXIDINE GLUCONATE 4 % EX LIQD: 60.0000 mL | Freq: Once | CUTANEOUS | Status: DC

## 2016-03-22 MED ORDER — SODIUM CHLORIDE 0.9 % IR SOLN
80.0000 mg | Status: AC
  Administered 2016-03-23: 80 mg

## 2016-03-22 MED ORDER — CHLORHEXIDINE GLUCONATE 4 % EX LIQD
60.0000 mL | Freq: Once | CUTANEOUS | Status: AC
  Administered 2016-03-22: 4 via TOPICAL
  Filled 2016-03-22: qty 60

## 2016-03-22 MED ORDER — CEFAZOLIN SODIUM-DEXTROSE 2-4 GM/100ML-% IV SOLN
2.0000 g | INTRAVENOUS | Status: AC
  Administered 2016-03-23: 2 g via INTRAVENOUS

## 2016-03-22 NOTE — Care Management Important Message (Signed)
Important Message  Patient Details  Name: Gary Leonard MRN: [id number] Date of Birth: 02/23/1931   Medicare Important Message Given:  Yes    [name] 03/22/2016, 10:39 AM

## 2016-03-22 NOTE — Progress Notes (Signed)
Utilization review completed.  

## 2016-03-22 NOTE — Progress Notes (Addendum)
SUBJECTIVE: The patient is doing well today.  At this time, he denies chest pain, shortness of breath, or any new concerns.  Daughter at bedside, no new questions.  . dofetilide  500 mcg Oral BID  . donepezil  10 mg Oral QHS  . escitalopram  10 mg Oral Daily  . furosemide  60 mg Oral Daily  . heparin subcutaneous  5,000 Units Subcutaneous Q8H  . isosorbide mononitrate  120 mg Oral BH-q7a  . nebivolol  10 mg Oral Daily  . pantoprazole  40 mg Oral Daily  . potassium chloride  10 mEq Oral Daily  . ramipril  2.5 mg Oral BID  . risperiDONE  0.25 mg Oral QHS  . rosuvastatin  20 mg Oral Daily  . vancomycin  1,000 mg Intravenous Q24H      OBJECTIVE: Physical Exam: Filed Vitals:   03/21/16 0602 03/21/16 1352 03/21/16 2050 03/22/16 0643  BP: 142/57 141/60 144/67 114/53  Pulse: 59 60 59 59  Temp: 98.3 F (36.8 C) 98.4 F (36.9 C) 97.5 F (36.4 C) 98.4 F (36.9 C)  TempSrc: Oral Oral Oral Oral  Resp: 18 16 17 18   Height:      Weight:      SpO2: 96% 96% 99% 97%    Intake/Output Summary (Last 24 hours) at 03/22/16 0835 Last data filed at 03/21/16 1357  Gross per 24 hour  Intake    480 ml  Output      1 ml  Net    479 ml    Telemetry reveals sinus rhythm, intermittent pacing  GEN- The patient is well appearing, oriented to self, daughter.   Head- normocephalic, atraumatic Eyes-  Sclera clear, conjunctiva pink Ears- hearing intact Oropharynx- clear Neck- supple, no JVP Lungs- Clear to ausculation bilaterally, normal work of breathing Heart- Regular rate and rhythm, no significant murmurs, no rubs or gallops GI- soft, NT, ND Extremities- no clubbing, cyanosis, or edema Skin- no rash or lesion Psych- euthymic mood, full affect Neuro- no gross physical deficits appreciated  PPM explant site is dry, sutures in place, no hematoma Temp-perm site, right IJ, stable, no bleeding.  LABS: Basic Metabolic Panel:  Recent Labs  78/29/5606/24/17 0349 03/21/16 0218  NA 133* 132*    K 3.6 3.4*  CL 97* 96*  CO2 29 28  GLUCOSE 86 92  BUN 23* 24*  CREATININE 1.34* 1.31*  CALCIUM 8.3* 8.5*  MG  --  2.1   Liver Function Tests: No results for input(s): AST, ALT, ALKPHOS, BILITOT, PROT, ALBUMIN in the last 72 hours. No results for input(s): LIPASE, AMYLASE in the last 72 hours. CBC:  Recent Labs  03/20/16 0349  WBC 7.3  HGB 7.7*  HCT 25.9*  MCV 79.2  PLT 220    ASSESSMENT AND PLAN:   1. PPM infection     S/p PPM extraction 03/17/16 by Dr. Idolina Primeraylor/Camnitz     Wounds are stable     Plans for new implant tomorrow     BC negative so far     On vanco, appreciate pharmacy help  2. Vascular dimentia      Follow  3. PAFib     CHA2DS2Vasc is at least 7     taken off a/c in May after GIB requiring multiple transfusions (no GI w/u was pursued given advanced age and rapidly advancing dementia)     On Tikosyn, EKG is paced      Check BMET with K+ replacement yesterday  4. CAD  No anginal c/o     No antiplatelet given GIB hx  5. HTN     Stable  Patient carries DNR status  Francis DowseRenee Ursuy, Cordelia Poche-C 03/22/2016 8:35 AM    EP Attending  Patient seen and examined. Agree with above. Stable for PPM insertion.  Leonia ReevesGregg Hence Derrick,M.D.

## 2016-03-22 NOTE — Progress Notes (Signed)
Patient ID: Gary Leonard, male   DOB: 10/04/1930, 82 y.o.   MRN: [id number]    Patient Name: Gary Leonard Date of Encounter: 03/22/2016     Active Problems:   Pacemaker electrode infection (HCC)    SUBJECTIVE  No chest pain or sob. No pain at incision.  CURRENT MEDS . dofetilide  500 mcg Oral BID  . donepezil  10 mg Oral QHS  . escitalopram  10 mg Oral Daily  . furosemide  60 mg Oral Daily  . heparin subcutaneous  5,000 Units Subcutaneous Q8H  . isosorbide mononitrate  120 mg Oral BH-q7a  . nebivolol  10 mg Oral Daily  . pantoprazole  40 mg Oral Daily  . potassium chloride  10 mEq Oral Daily  . ramipril  2.5 mg Oral BID  . risperiDONE  0.25 mg Oral QHS  . rosuvastatin  20 mg Oral Daily  . vancomycin  1,000 mg Intravenous Q24H    OBJECTIVE  Filed Vitals:   03/21/16 0602 03/21/16 1352 03/21/16 2050 03/22/16 0643  BP: 142/57 141/60 144/67 114/53  Pulse: 59 60 59 59  Temp: 98.3 F (36.8 C) 98.4 F (36.9 C) 97.5 F (36.4 C) 98.4 F (36.9 C)  TempSrc: Oral Oral Oral Oral  Resp: 18 16 17 18   Height:      Weight:      SpO2: 96% 96% 99% 97%    Intake/Output Summary (Last 24 hours) at 03/22/16 0806 Last data filed at 03/21/16 1357  Gross per 24 hour  Intake    480 ml  Output      1 ml  Net    479 ml   Filed Weights   03/17/16 1226 03/17/16 1947 03/20/16 0457  Weight: 163 lb (73.936 kg) 162 lb 14.7 oz (73.9 kg) 166 lb 0.1 oz (75.3 kg)    PHYSICAL EXAM  General: Pleasant, NAD. Neuro: Alert and oriented X 3. Moves all extremities spontaneously. Psych: Normal affect. HEENT:  Normal  Neck: Supple without bruits or JVD. Lungs:  Resp regular and unlabored, CTA. Left chest incision is healing well. Heart: RRR no s3, s4, or murmurs. Abdomen: Soft, non-tender, non-distended, BS + x 4.  Extremities: No clubbing, cyanosis or edema. DP/PT/Radials 2+ and equal bilaterally.  Accessory Clinical Findings  CBC  Recent Labs  03/20/16 0349  WBC 7.3  HGB 7.7*  HCT  25.9*  MCV 79.2  PLT 220   Basic Metabolic Panel  Recent Labs  03/20/16 0349 03/21/16 0218  NA 133* 132*  K 3.6 3.4*  CL 97* 96*  CO2 29 28  GLUCOSE 86 92  BUN 23* 24*  CREATININE 1.34* 1.31*  CALCIUM 8.3* 8.5*  MG  --  2.1   Liver Function Tests No results for input(s): AST, ALT, ALKPHOS, BILITOT, PROT, ALBUMIN in the last 72 hours. No results for input(s): LIPASE, AMYLASE in the last 72 hours. Cardiac Enzymes No results for input(s): CKTOTAL, CKMB, CKMBINDEX, TROPONINI in the last 72 hours. BNP Invalid input(s): POCBNP D-Dimer No results for input(s): DDIMER in the last 72 hours. Hemoglobin A1C No results for input(s): HGBA1C in the last 72 hours. Fasting Lipid Panel No results for input(s): CHOL, HDL, LDLCALC, TRIG, CHOLHDL, LDLDIRECT in the last 72 hours. Thyroid Function Tests No results for input(s): TSH, T4TOTAL, T3FREE, THYROIDAB in the last 72 hours.  Invalid input(s): FREET3  TELE  Ventricular pacing  Radiology/Studies  No results found.  ASSESSMENT AND PLAN  1. PM pocket infection - he is s/p extraction  and anti-biotic therapy.  2. Sinus node dysfunction - he will undergo new PPM insertion tomorrow. 3. CAD -he denies anginal symptoms. 4. Hypokalemia - repleted yesterday.  Raeqwon Lux,M.D.  03/22/2016 8:06 AM

## 2016-03-23 ENCOUNTER — Encounter (HOSPITAL_COMMUNITY)
Admission: RE | Disposition: A | Discharge status: Home / Self Care | Payer: Self-pay | Source: Ambulatory Visit | Attending: Internal Medicine

## 2016-03-23 ENCOUNTER — Encounter (HOSPITAL_COMMUNITY): Payer: Self-pay | Admitting: Internal Medicine

## 2016-03-23 DIAGNOSIS — I495 Sick sinus syndrome: Secondary | ICD-10-CM

## 2016-03-23 HISTORY — PX: EP IMPLANTABLE DEVICE: SHX172B

## 2016-03-23 LAB — CREATININE, SERUM
Creatinine, Ser: 1.32 mg/dL — ABNORMAL HIGH (ref 0.61–1.24)
GFR, EST AFRICAN AMERICAN: 56 mL/min — AB (ref >60)
GFR, EST NON AFRICAN AMERICAN: 48 mL/min — AB (ref >60)

## 2016-03-23 LAB — CBC
HCT: 26.6 % — ABNORMAL LOW (ref 39.0–52.0)
Hemoglobin: 7.9 g/dL — ABNORMAL LOW (ref 13.0–17.0)
MCH: 23.4 pg — ABNORMAL LOW (ref 26.0–34.0)
MCHC: 29.7 g/dL — AB (ref 30.0–36.0)
MCV: 78.7 fL (ref 78.0–100.0)
PLATELETS: 265 10*3/uL (ref 150–400)
RBC: 3.38 MIL/uL — ABNORMAL LOW (ref 4.22–5.81)
RDW: 17.2 % — AB (ref 11.5–15.5)
WBC: 5.4 10*3/uL (ref 4.0–10.5)

## 2016-03-23 SURGERY — PACEMAKER IMPLANT
Anesthesia: LOCAL

## 2016-03-23 MED ORDER — LIDOCAINE HCL (PF) 1 % IJ SOLN
INTRAMUSCULAR | Status: AC
  Filled 2016-03-23: qty 60

## 2016-03-23 MED ORDER — CHLORHEXIDINE GLUCONATE 4 % EX LIQD
CUTANEOUS | Status: AC
  Administered 2016-03-23: 08:00:00
  Filled 2016-03-23: qty 15

## 2016-03-23 MED ORDER — SODIUM CHLORIDE 0.9 % IR SOLN
Status: AC
  Filled 2016-03-23: qty 2

## 2016-03-23 MED ORDER — CEFAZOLIN IN D5W 1 GM/50ML IV SOLN
1.0000 g | Freq: Four times a day (QID) | INTRAVENOUS | Status: AC
  Administered 2016-03-23 – 2016-03-24 (×3): 1 g via INTRAVENOUS
  Filled 2016-03-23 (×3): qty 50

## 2016-03-23 MED ORDER — CEFAZOLIN SODIUM-DEXTROSE 2-4 GM/100ML-% IV SOLN
INTRAVENOUS | Status: AC
  Filled 2016-03-23: qty 100

## 2016-03-23 MED ORDER — CEFAZOLIN IN D5W 1 GM/50ML IV SOLN: INTRAVENOUS | Status: DC | PRN

## 2016-03-23 MED ORDER — LIDOCAINE HCL (PF) 1 % IJ SOLN
INTRAMUSCULAR | Status: AC
  Filled 2016-03-23: qty 30

## 2016-03-23 MED ORDER — HEPARIN (PORCINE) IN NACL 2-0.9 UNIT/ML-% IJ SOLN
INTRAMUSCULAR | Status: DC | PRN
  Administered 2016-03-23: 500 mL

## 2016-03-23 MED ORDER — HEPARIN SODIUM (PORCINE) 5000 UNIT/ML IJ SOLN: 5000.0000 [IU] | Freq: Three times a day (TID) | INTRAMUSCULAR | Status: DC

## 2016-03-23 MED ORDER — ACETAMINOPHEN 325 MG PO TABS: 325.0000 mg | ORAL_TABLET | ORAL | Status: DC | PRN

## 2016-03-23 MED ORDER — LIDOCAINE HCL (PF) 1 % IJ SOLN
INTRAMUSCULAR | Status: DC | PRN
  Administered 2016-03-23: 60 mL

## 2016-03-23 MED ORDER — HEPARIN (PORCINE) IN NACL 2-0.9 UNIT/ML-% IJ SOLN
INTRAMUSCULAR | Status: AC
  Filled 2016-03-23: qty 500

## 2016-03-23 MED ORDER — ONDANSETRON HCL 4 MG/2ML IJ SOLN: 4.0000 mg | Freq: Four times a day (QID) | INTRAMUSCULAR | Status: DC | PRN

## 2016-03-23 SURGICAL SUPPLY — 10 items
CABLE SURGICAL S-101-97-12 (CABLE) ×2 IMPLANT
GUIDEWIRE ANGLED .035X150CM (WIRE) ×2 IMPLANT
INGEVITY MRI 7740-45CM (Lead) ×2 IMPLANT
INGEVITY MRI 7741-52CM (Lead) ×2 IMPLANT
LEAD PACING INGEVITY MRI 45CM (Lead) ×1 IMPLANT
LEAD PACING INGEVITY MRI 52CM (Lead) ×1 IMPLANT
PACEMAKER ACCOLADE DR-EL (Pacemaker) ×2 IMPLANT
PAD DEFIB LIFELINK (PAD) ×2 IMPLANT
SHEATH CLASSIC 7F (SHEATH) ×4 IMPLANT
TRAY PACEMAKER INSERTION (PACKS) ×2 IMPLANT

## 2016-03-23 NOTE — Progress Notes (Signed)
SUBJECTIVE: The patient is doing well today.  At this time, he denies chest pain, shortness of breath, or any new concerns.  Daughter at bedside, no new questions.  Marland Kitchen.  ceFAZolin (ANCEF) IV  2 g Intravenous On Call  . chlorhexidine      . dofetilide  500 mcg Oral BID  . donepezil  10 mg Oral QHS  . escitalopram  10 mg Oral Daily  . furosemide  60 mg Oral Daily  . gentamicin irrigation  80 mg Irrigation On Call  . heparin subcutaneous  5,000 Units Subcutaneous Q8H  . isosorbide mononitrate  120 mg Oral BH-q7a  . nebivolol  10 mg Oral Daily  . pantoprazole  40 mg Oral Daily  . potassium chloride  10 mEq Oral Daily  . ramipril  2.5 mg Oral BID  . risperiDONE  0.25 mg Oral QHS  . rosuvastatin  20 mg Oral Daily  . sodium chloride flush  3 mL Intravenous Q12H  . sodium chloride flush  3 mL Intravenous Q12H  . vancomycin  1,000 mg Intravenous Q24H   . sodium chloride    . sodium chloride    . sodium chloride      OBJECTIVE: Physical Exam: Filed Vitals:   03/22/16 0958 03/22/16 1430 03/22/16 2016 03/23/16 0501  BP: 124/52 125/59 126/69 117/57  Pulse:  59 56 59  Temp:  97.6 F (36.4 C) 97.5 F (36.4 C) 97.8 F (36.6 C)  TempSrc:  Oral Oral Oral  Resp:  18 20 20   Height:      Weight:      SpO2:  98% 96% 98%    Intake/Output Summary (Last 24 hours) at 03/23/16 0755 Last data filed at 03/22/16 1300  Gross per 24 hour  Intake    480 ml  Output      0 ml  Net    480 ml    Telemetry reveals paced rhythm  GEN- The patient is well appearing, oriented to self, daughter, conversational.   Head- normocephalic, atraumatic Eyes-  Sclera clear, conjunctiva pink Ears- hearing intact Oropharynx- clear Neck- supple, no JVP Lungs- Clear to ausculation bilaterally, normal work of breathing Heart- Regular rate and rhythm, no significant murmurs, no rubs or gallops GI- soft, NT, ND Extremities- no clubbing, cyanosis, or edema Skin- no rash or lesion Psych- euthymic mood, full  affect Neuro- no gross physical deficits appreciated  PPM explant site is dry, sutures in place, no hematoma Temp-perm site, left IJ, stable, no bleeding.  LABS: Basic Metabolic Panel:  Recent Labs  16/06/9605/25/17 0218 03/22/16 1203  NA 132* 133*  K 3.4* 4.0  CL 96* 99*  CO2 28 28  GLUCOSE 92 97  BUN 24* 20  CREATININE 1.31* 1.28*  CALCIUM 8.5* 8.5*  MG 2.1  --     ASSESSMENT AND PLAN:   1. PPM infection     S/p PPM extraction 03/17/16 by Dr. Idolina Primeraylor/Camnitz     Wounds are stable     Plans for new implant today, anticipate discharge tomorrow     Wound culture/pacer leads negative      On vanco, appreciate pharmacy help  2. Vascular dimentia      Follow  3. PAFib     CHA2DS2Vasc is at least 7     taken off a/c in May after GIB requiring multiple transfusions (no GI w/u was pursued given advanced age and rapidly advancing dementia)     On Tikosyn, EKG is paced  4.  CAD     No anginal c/o     No antiplatelet given GIB hx  5. HTN     Stable  Patient carries DNR status  Francis DowseRenee Ursuy, Cordelia Poche-C 03/23/2016 7:55 AM  EP Attending  Patient seen and examined. Agree with above. He is stable for PPM which will take place today.   Leonia ReevesGregg Taylor,M.D.

## 2016-03-24 ENCOUNTER — Inpatient Hospital Stay (HOSPITAL_COMMUNITY): Payer: Medicare Other

## 2016-03-24 DIAGNOSIS — I495 Sick sinus syndrome: Secondary | ICD-10-CM

## 2016-03-24 MED ORDER — CEPHALEXIN 500 MG PO CAPS: 500.0000 mg | ORAL_CAPSULE | Freq: Two times a day (BID) | ORAL | Status: DC

## 2016-03-24 MED FILL — Sodium Chloride Irrigation Soln 0.9%: Qty: 500 | Status: AC

## 2016-03-24 MED FILL — Gentamicin Sulfate Inj 40 MG/ML: INTRAMUSCULAR | Qty: 2 | Status: AC

## 2016-03-24 NOTE — Discharge Summary (Signed)
ELECTROPHYSIOLOGY PROCEDURE DISCHARGE SUMMARY    Patient ID: Gary Leonard,  MRN: [id number], DOB/AGE: 80/10/1930 80 y.o.  Admit date: 03/17/2016 Discharge date: 03/24/2016  Primary Care Physician: Joycelyn RuaMEYERS, STEPHEN, MD Electrophysiologist: Dr. Elberta Fortisamnitz  Primary Discharge Diagnosis:  1. PPM pocket infection     Secondary Discharge Diagnosis:  1. Vascular dementia 2. PAFib     CHA2DS2Vasc is at least 7     taken off a/c in May after GIB requiring multiple transfusions (no GI w/u was pursued given advanced age and rapidly advancing dementia) 3. CAD 4. HTN  No Known Allergies   Procedures This Admission:  1. Extraction of an infected dual-chamber pacemaker system and insertion of a new temporary permanent single-chamber pacemaker 03/17/16, Drs. Ladona Ridgelaylor and Camnitz 2.  Implantation of a BSci dual chamber PPM on 03/23/16 by Dr Ladona Ridgelaylor.  The patient received a [employer] (serial number [id number]) pacemaker, [employer] (serial number [id number]) right atrial lead and a [employer] (serial number [id number]) right ventricular lead, followed by temp-permanent PPM was removed.  There were no immediate post procedure complications. 3.  CXR on 03/24/16 demonstrated no pneumothorax status post device implantation.   Brief HPI: Gary Leonard is a 82 y.o. male was referred to electrophysiology in the outpatient setting noting that his chronic PPM site had developed drainage after a trauma and [facility] new [facility]PM implantation.  Past medical history includes PAFib, bradycardia, CAD, advancing vascular dementia, HTN.  Risks, benefits, and alternatives to PPM implantation were reviewed with the patient and his daughter who wished to proceed.   Hospital Course:  The patient was admitted and underwent wxtraction of his chronic PPM with oimplant of a temp-permm system on 03/17/16, his pocket and lead microbiology were sent and remained negative.  He was given IV Vancomycin daily while here, and  underwent implantation of a PPM with details as outlined above on 03/23/16. He was monitored on telemetry during his entire stay which demonstrated SR, generally AP paced, occ AV pacing.  Left/right chest was without hematoma or ecchymosis, temp-perm site remained stable as well.  The device was interrogated and found to be functioning normally.  CXR was obtained and demonstrated no pneumothorax status post device implantation.  We will rx a week of Keflex 500mg  BID, the patient's daughter is instructed to call if the patient developed diarrhea.  Wound care, arm mobility, and restrictions were reviewed with the patient/daughter.  The patient was examined by Dr. Ladona Ridgelaylor and considered stable for discharge to home.    Physical Exam: Filed Vitals:   03/23/16 2031 03/24/16 0300 03/24/16 0753 03/24/16 0954  BP: 125/56 141/71  118/49  Pulse: 60 59 60 59  Temp: 98 F (36.7 C) 97.6 F (36.4 C)  97.6 F (36.4 C)  TempSrc: Oral Oral  Oral  Resp: 18 16  17   Height:      Weight:      SpO2: 96% 96%  96%    GEN- The patient is well appearing, alert and oriented x 2 today.   HEENT: normocephalic, atraumatic; sclera clear, conjunctiva pink; hearing intact; oropharynx clear; neck supple, no JVP Lungs- Clear to ausculation bilaterally, normal work of breathing.  No wheezes, rales, rhonchi Heart- Regular rate and rhythm, no murmurs, rubs or gallops GI- soft, non-tender, non-distended, bowel sounds present, no hepatosplenomegaly Extremities- no clubbing, cyanosis, or edema MS- no significant deformity or atrophy Skin- warm and dry, no rash or lesion Psych- euthymic mood, full affect Neuro- has baseline  dementia, no gross physical deficits  R chest explant site is stable, sutures removed without difficulty, wound is well healed, no hematoma, drainage, no heat or erythema. R IJ site is stable, no hematoma no bleeding or drainage L new implant site is stable, no hematoma or ecchymosis   Labs:   Lab  Results  Component Value Date   WBC 5.4 03/23/2016   HGB 7.9* 03/23/2016   HCT 26.6* 03/23/2016   MCV 78.7 03/23/2016   PLT 265 03/23/2016     Recent Labs Lab 03/22/16 1203 03/23/16 1534  NA 133*  --   K 4.0  --   CL 99*  --   CO2 28  --   BUN 20  --   CREATININE 1.28* 1.32*  CALCIUM 8.5*  --   GLUCOSE 97  --     Discharge Medications:    Medication List    TAKE these medications        cephALEXin 500 MG capsule  Commonly known as:  KEFLEX  Take 1 capsule (500 mg total) by mouth 2 (two) times daily.     dofetilide 500 MCG capsule  Commonly known as:  TIKOSYN  Take 500 mcg by mouth 2 (two) times daily.     donepezil 10 MG tablet  Commonly known as:  ARICEPT  Take 10 mg by mouth at bedtime.     escitalopram 10 MG tablet  Commonly known as:  LEXAPRO  Take 10 mg by mouth daily.     furosemide 40 MG tablet  Commonly known as:  LASIX  Take 60 mg by mouth daily.     isosorbide mononitrate 120 MG 24 hr tablet  Commonly known as:  IMDUR  Take 120 mg by mouth every morning.     LORazepam 1 MG tablet  Commonly known as:  ATIVAN  Take 1 mg by mouth at bedtime as needed for sleep (Take only IF risperidone does not help with rest at bedtime).     nebivolol 10 MG tablet  Commonly known as:  BYSTOLIC  Take 10 mg by mouth daily.     nitroGLYCERIN 0.4 MG SL tablet  Commonly known as:  NITROSTAT  Place 0.4 mg under the tongue every 5 (five) minutes as needed for chest pain.     pantoprazole 40 MG tablet  Commonly known as:  PROTONIX  Take 40 mg by mouth daily.     potassium chloride 10 MEQ tablet  Commonly known as:  K-DUR  Take 10 mEq by mouth daily.     ramipril 2.5 MG capsule  Commonly known as:  ALTACE  Take 2.5 mg by mouth 2 (two) times daily.     risperiDONE 0.25 MG tablet  Commonly known as:  RISPERDAL  Take 0.25 mg by mouth at bedtime.     rosuvastatin 20 MG tablet  Commonly known as:  CRESTOR  Take 20 mg by mouth daily.        Disposition:   Home with his daughter  Follow-up Information    [facility] [address] On 03/29/2016.   Specialty:  Cardiology   Why:  at [facility] for wound check    Contact information:   [address], [address] [place] [id number] [phone]      Follow up with Will Jorja LoaMartin Camnitz, MD On 04/14/2016.   Specialty:  Cardiology   Why:  at 10:30AM    Contact information:   [address] [place] [address] [phone]       Duration of Discharge Encounter: Greater than 30 minutes including physician time.  Norma Fredrickson, PA-C 03/24/2016 1:11 PM     EP Attending  Patient seen and examined. His incision look good and the CXR demonstrates no PTX and leads in stable position. His device interogation demonstrates normal device function. Because his AV conduction is so poor, he was pacing in the Ventricle despite an AV delay of 400. He AV delay was reprogrammed to more physiologic levels of 160/180 sense/pace. He will followup as per usual.  Leonia Reeves.D.

## 2016-03-24 NOTE — Discharge Instructions (Signed)
° ° °  Supplemental Discharge Instructions for  Pacemaker/Defibrillator Patients  Activity No heavy lifting or vigorous activity with your right arm for 6 to 8 weeks.  Do not raise your right arm above your head for one week.  Gradually raise your affected arm as drawn below.              03/27/16                      03/28/16                      03/29/16                    03/30/16 __  NO DRIVING the patient does not drive.  WOUND CARE - Keep the wound area clean and dry.  Do not get this area wet for one week. No showers for one week; you may shower on 03/30/16    . - The tape/steri-strips on your wound will fall off; do not pull them off.  No bandage is needed on the site.  DO  NOT apply any creams, oils, or ointments to the wound area. - If you notice any drainage or discharge from the wound, any swelling or bruising at the site, or you develop a fever > 101? F after you are discharged home, call the office at once.  Special Instructions - You are still able to use cellular telephones; use the ear opposite the side where you have your pacemaker/defibrillator.  Avoid carrying your cellular phone near your device. - When traveling through airports, show security personnel your identification card to avoid being screened in the metal detectors.  Ask the security personnel to use the hand wand. - Avoid arc welding equipment, MRI testing (magnetic resonance imaging), TENS units (transcutaneous nerve stimulators).  Call the office for questions about other devices. - Avoid electrical appliances that are in poor condition or are not properly grounded. - Microwave ovens are safe to be near or to operate.  Additional information for defibrillator patients should your device go off: - If your device goes off ONCE and you feel fine afterward, notify the device clinic nurses. - If your device goes off ONCE and you do not feel well afterward, call 911. - If your device goes off TWICE, call 911. - If your  device goes off THREE times in one day, call 911.  DO NOT DRIVE YOURSELF OR A FAMILY MEMBER WITH A DEFIBRILLATOR TO THE HOSPITAL--CALL 911.

## 2016-03-24 NOTE — Progress Notes (Signed)
Antibiotic CONSULT NOTE - Follow Up Consult  Pharmacy Consult for Vancomycin Indication: pacer pocket infection  No Known Allergies  Patient Measurements: Height: 5\' 7"  (170.2 cm) Weight: 166 lb 0.1 oz (75.3 kg) IBW/kg (Calculated) : 66.1 Heparin Dosing Weight:    Vital Signs: Temp: 97.6 F (36.4 C) (06/28 0300) Temp Source: Oral (06/28 0300) BP: 141/71 mmHg (06/28 0300) Pulse Rate: 60 (06/28 0753)  Labs:  Recent Labs  03/22/16 1203 03/23/16 1534  HGB  --  7.9*  HCT  --  26.6*  PLT  --  265  CREATININE 1.28* 1.32*    Estimated Creatinine Clearance: 39.6 mL/min (by C-G formula based on Cr of 1.32).   Assessment: 82 y.o. male admitted on 03/17/2016 with chest pain, GIB and now infected pacemaker pocket s/p removal 6/21. Pharmacy has been consulted for vancomycin dosing.  Infectious Disease:infected PM pocket, no fevers, wbc normal. Scr 1.32 relatively stable. Re-implant planned today. Cultures negative.  Ancef 6/21>6/22  Vanc 6/22> --6/25 VT 12>>no changes   6/21 WCx: negative 6/21 MRSA PCR: neg  Cardiovascular:hx CAD - VSS, HR 60, he continues on tikosyn,  --Qtc ~537 (6/21)>>5/10 (6/28) Mg 2.1, K=4 Bump in scr may need to reduce tikosyn - no changes by EP, Md sticky note Meds: Continue lasix, imdur, nebivolol, altace, K+, crestor   Goal of Therapy:  Vanco level 15-20    Plan:  Continue Vancomycin 1g q24h Tikosyn 500 BID (dose should be reduced to 125-250mg  BID at least due to renal function  Chana Lindstrom S. Merilynn Finlandobertson, PharmD, BCPS Clinical Staff Pharmacist Pager [phone]  Misty Stanleyobertson, Xochil Shanker Stillinger 03/24/2016,9:45 AM

## 2016-03-24 NOTE — Care Management Important Message (Signed)
Important Message  Patient Details  Name: Gary Leonard [place]telle MRN: [id number] Date of Birth: 06/11/1931   Medicare Important Message Given:  Yes    Bernadette HoitShoffner, Ruger Saxer Coleman 03/24/2016, 10:02 AM

## 2016-03-29 ENCOUNTER — Encounter: Payer: Self-pay | Admitting: Cardiology

## 2016-03-29 ENCOUNTER — Ambulatory Visit (INDEPENDENT_AMBULATORY_CARE_PROVIDER_SITE_OTHER): Payer: Medicare Other | Admitting: *Deleted

## 2016-03-29 DIAGNOSIS — Z950 Presence of cardiac pacemaker: Secondary | ICD-10-CM

## 2016-03-29 LAB — CUP PACEART INCLINIC DEVICE CHECK
Brady Statistic RA Percent Paced: 100 %
Brady Statistic RV Percent Paced: 100 %
Date Time Interrogation Session: 20170703040000
Lead Channel Impedance Value: 600 Ohm
Lead Channel Impedance Value: 857 Ohm
Lead Channel Pacing Threshold Amplitude: 1 V
Lead Channel Pacing Threshold Pulse Width: 0.4 ms
Lead Channel Pacing Threshold Pulse Width: 0.4 ms
Lead Channel Setting Pacing Amplitude: 1.5 V
MDC IDC MSMT LEADCHNL RA PACING THRESHOLD AMPLITUDE: 0.7 V
MDC IDC SET LEADCHNL RA PACING AMPLITUDE: 3.5 V
MDC IDC SET LEADCHNL RV PACING PULSEWIDTH: 0.4 ms
MDC IDC SET LEADCHNL RV SENSING SENSITIVITY: 2.5 mV
Pulse Gen Serial Number: 755133

## 2016-03-29 NOTE — Progress Notes (Signed)
Wound check appointment s/p extraction and reimplantation. Steri-strips removed. Wound without redness or edema. Suture noted at medial incision- clipped, patient instructed to leave open to air and to wash in the shower with soap and water. Extraction site on left chest- laterally not approximated, subcutaneous tissue visible, scant bloody drainage noted. Dr. Elberta Fortisamnitz evaluated site- unable to approximate edges with steri-strips. Instructed patient to cover loosely with gauze and paper tape, bathe around wound and to change dressing daily. Daughter and patient verbalize understanding. Normal device function. Thresholds, sensing, and impedances consistent with implant measurements. Device programmed at 3.5V for extra safety margin until 3 month visit. Histogram distribution appropriate for patient and level of activity. No mode switches or high ventricular rates noted. Patient educated about wound care, arm mobility, lifting restrictions. ROV with device clinic 04/05/16 for wound re-check and with WC in September.

## 2016-04-05 ENCOUNTER — Ambulatory Visit: Payer: Medicare Other

## 2016-04-06 ENCOUNTER — Ambulatory Visit (INDEPENDENT_AMBULATORY_CARE_PROVIDER_SITE_OTHER): Payer: Medicare Other | Admitting: *Deleted

## 2016-04-06 DIAGNOSIS — I480 Paroxysmal atrial fibrillation: Secondary | ICD-10-CM

## 2016-04-06 NOTE — Progress Notes (Signed)
Wound recheck s/p ppm explant from L chest on 03/23/16. Scab removed from L side of incision. Incision edges remain un-approximated with subcutaneous tissue visible. Dr.Camnitz assessed wound and recommended follow up in 2 weeks. No further changes recommended at this time. Wound redressed with antibiotic ointment and band aid. I encouraged patient to continue the same self care regimen that he had been using at home and to call if he begins to have fever, chills, redness, or drainage from the area. Patient voiced understanding.

## 2016-04-08 DIAGNOSIS — I4891 Unspecified atrial fibrillation: Secondary | ICD-10-CM | POA: Diagnosis not present

## 2016-04-08 DIAGNOSIS — I509 Heart failure, unspecified: Secondary | ICD-10-CM | POA: Diagnosis not present

## 2016-04-14 ENCOUNTER — Encounter: Payer: Medicare Other | Admitting: Cardiology

## 2016-04-20 DIAGNOSIS — H2513 Age-related nuclear cataract, bilateral: Secondary | ICD-10-CM | POA: Diagnosis not present

## 2016-05-03 ENCOUNTER — Ambulatory Visit (INDEPENDENT_AMBULATORY_CARE_PROVIDER_SITE_OTHER): Payer: Medicare Other | Admitting: *Deleted

## 2016-05-03 ENCOUNTER — Encounter (INDEPENDENT_AMBULATORY_CARE_PROVIDER_SITE_OTHER): Payer: Self-pay

## 2016-05-03 DIAGNOSIS — I480 Paroxysmal atrial fibrillation: Secondary | ICD-10-CM

## 2016-05-03 NOTE — Progress Notes (Signed)
Wound recheck from appt on 04/06/16. Incision edges are un-approximated on lateral edge of R device implant site with subcutaneous tissue visible. Pacemaker is not visible. Stitch clipped from upper edge of opening. Dr.Camnitz assessed wound and recommended that steri strips be applied to site and patient f/u next week when Dr.Taylor is in the office.  Wound redressed with antibiotic ointment and steri strips. I encouraged patient to continue the same self care regimen that he had been using at home and to call if he begins to have fever, chills, redness, or drainage from the area. Patient voiced understanding.

## 2016-05-12 ENCOUNTER — Encounter (INDEPENDENT_AMBULATORY_CARE_PROVIDER_SITE_OTHER): Payer: Self-pay

## 2016-05-12 ENCOUNTER — Ambulatory Visit (INDEPENDENT_AMBULATORY_CARE_PROVIDER_SITE_OTHER): Payer: Medicare Other | Admitting: *Deleted

## 2016-05-12 DIAGNOSIS — I480 Paroxysmal atrial fibrillation: Secondary | ICD-10-CM

## 2016-05-12 MED ORDER — CLINDAMYCIN HCL 300 MG PO CAPS: 300.0000 mg | ORAL_CAPSULE | Freq: Two times a day (BID) | ORAL | 0 refills | Status: DC

## 2016-05-13 NOTE — Progress Notes (Signed)
Wound recheck from appt on 05/03/2016. Erythema still remains around ppm implant incision on R chest. R corner of incision remains un-approximated. Dr.Taylor assessed wound and attempted to remove any possible stitches from the un-approximated area. No stitches present. Scant amount of white drainage oozed out of the area. Dr.Taylor asked patient about his bathing habits. Although patient insists that he bathes regularly, daughter admitted that patient does not prefer to bathe on a regular basis. Dr.Taylor strongly encouraged patient to make sure that he washes himself completely at least once daily with antibacterial soap, and washes his ppm site at least twice daily with the soap. Dr.Taylor also recommended that patient pat the area dry and leave it open to air for the majority of the day. Patient and daughter voiced understanding of instructions. Dr.Taylor also prescribed Doxycycline 300BID x 7 days. Patient was encouraged to complete the entire Rx. Appointment has been scheduled for patient to f/u with the device clinic on 8/25 while GT is in the office.   Explant incision on L chest is now well healed.

## 2016-05-21 ENCOUNTER — Ambulatory Visit (INDEPENDENT_AMBULATORY_CARE_PROVIDER_SITE_OTHER): Payer: Medicare Other | Admitting: *Deleted

## 2016-05-21 DIAGNOSIS — Z950 Presence of cardiac pacemaker: Secondary | ICD-10-CM

## 2016-05-21 NOTE — Progress Notes (Signed)
[name] presents to Device Clinic today for wound re-check s/p PPM implantation 03/23/16 (after PPM explant at left chest). Wound approximated with scabbing and some granulation tissue. Wound dry with mild erythema today. [name]'s daughter reports occasional small amounts of serous/serosanguinous drainage on his t-shirt. He has finished his course of clindamycin. Dr. Ladona Ridgelaylor evaluated wound- no further intervention today- he recommends continuing twice daily cleaning of incision with antibacterial soap and water. His daughter inquires about a wound on his back that Dr. Ladona Ridgelaylor made recommendations for previously. She reports that the wound is half the size it was at the last appointment. He recommends continuing cleaning twice daily and using sterile cotton-tipped applicators to gently clean the wound bed to promote tissue growth/healing. [name] and his daughter verbalize understanding and know to call the device clinic if erythema increases, swelling occurs or if he develops fever & chills. ROV with Dr. Elberta Leonard s/p device implantation on 06/23/16.

## 2016-06-22 ENCOUNTER — Encounter: Payer: Self-pay | Admitting: Cardiology

## 2016-06-22 DIAGNOSIS — F0151 Vascular dementia with behavioral disturbance: Secondary | ICD-10-CM | POA: Diagnosis not present

## 2016-06-22 DIAGNOSIS — Z23 Encounter for immunization: Secondary | ICD-10-CM | POA: Diagnosis not present

## 2016-06-22 DIAGNOSIS — K219 Gastro-esophageal reflux disease without esophagitis: Secondary | ICD-10-CM | POA: Diagnosis not present

## 2016-06-22 NOTE — Progress Notes (Signed)
Electrophysiology Office Note   Date:  06/23/2016   ID:  Gary Leonard, DOB [birth year], MRN [id number]  PCP:  Joycelyn Rua, MD  Primary Electrophysiologist:  Regan Lemming, MD    Chief Complaint  Patient presents with  . Pacemaker Check    3 months post implant     History of Present Illness: Gary Leonard is a 82 y.o. male who presents today for electrophysiology evaluation.     Hx CAD (s/p CABG in 1979, 1988, 2002), chronic CHF (dx a few months ago), CVA in 1970s, Afib on Xarelto (for many years), has PPM, vascular dementia dx 01/2015 and HTN. Found to have infected pacemaker after a fall.  Dual chamber pacemaker explanted with reimplant on the right side.  Had issues with wound healing. Most recently recommendation for cleaning with antibacterial soap.   Today, he denies symptoms of palpitations,  shortness of breath, orthopnea, PND, lower extremity edema, claudication, dizziness, presyncope, syncope, bleeding, or neurologic sequela. The patient is tolerating medications without difficulties and is otherwise without complaint today. He has been having chest pressure over the last week and a half. He was at a football game last weekend, and when walking up a hill, developed chest pain that improved with rest. He had episodes of chest pain at other times during the weekend, but they were likely related to food. On walking into the clinic, he had another episode of chest pain which resolved with rest. In speaking with his daughter, she does not feel that aggressive therapies are warranted at this time.  Past Surgical History:  Procedure Laterality Date  . CARDIAC SURGERY     open heart x 3  . CORONARY ARTERY BYPASS GRAFT     1979    1988   . EP IMPLANTABLE DEVICE N/A 03/23/2016   Procedure: Pacemaker Implant;  Surgeon: Marinus Maw, MD;  Location: [facility] INVASIVE CV LAB;  Service: Cardiovascular;  Laterality: N/A;  . HERNIA REPAIR    . INSERT / REPLACE / REMOVE PACEMAKER    .  PACEMAKER LEAD REMOVAL N/A 03/17/2016   Procedure: PACEMAKER LEAD REMOVAL;  Surgeon: Marinus Maw, MD;  [facility];  Service: Cardiovascular;  Laterality: N/A;  . TEE WITHOUT CARDIOVERSION N/A 03/17/2016   Procedure: TRANSESOPHAGEAL ECHOCARDIOGRAM (TEE);  Surgeon: Marinus Maw, MD;  Location: [facility] OR;  Service: Cardiovascular;  Laterality: N/A;  . TOTAL HIP ARTHROPLASTY Left   . TOTAL SHOULDER ARTHROPLASTY Left      Current Outpatient Prescriptions  Medication Sig Dispense Refill  . dofetilide (TIKOSYN) 500 MCG capsule Take 500 mcg by mouth 2 (two) times daily.    Marland Kitchen donepezil (ARICEPT) 10 MG tablet Take 10 mg by mouth at bedtime.    Marland Kitchen escitalopram (LEXAPRO) 10 MG tablet Take 10 mg by mouth daily.    . furosemide (LASIX) 40 MG tablet Take 60 mg by mouth daily.     . isosorbide mononitrate (IMDUR) 120 MG 24 hr tablet Take 120 mg by mouth every morning.     Marland Kitchen LORazepam (ATIVAN) 1 MG tablet Take 1 mg by mouth at bedtime as needed for sleep (Take only IF risperidone does not help with rest at bedtime).     . nebivolol (BYSTOLIC) 10 MG tablet Take 10 mg by mouth daily.    . nitroGLYCERIN (NITROSTAT) 0.4 MG SL tablet Place 0.4 mg under the tongue every 5 (five) minutes as needed for chest pain.    . pantoprazole (PROTONIX) 40 MG tablet Take 40  mg by mouth daily.    . potassium chloride (K-DUR) 10 MEQ tablet Take 10 mEq by mouth daily.    . ramipril (ALTACE) 2.5 MG capsule Take 2.5 mg by mouth 2 (two) times daily.    . risperiDONE (RISPERDAL) 0.25 MG tablet Take 0.25 mg by mouth at bedtime.    . rosuvastatin (CRESTOR) 20 MG tablet Take 20 mg by mouth daily.     No current facility-administered medications for this visit.     Allergies:   Review of patient's allergies indicates no known allergies.   Social History:  The patient  reports that he quit smoking about 43 years ago. His smoking use included Cigarettes. He quit after 40.00 years of use. He has never used smokeless tobacco. He  reports that he does not drink alcohol or use drugs.   Family History:  The patient's family history includes Heart disease in his sister.    ROS:  Please see the history of present illness.   Otherwise, review of systems is positive for chest pressure.   All other systems are reviewed and negative.    PHYSICAL EXAM: VS:  BP 116/62   Pulse 60   Ht 5\' 7"  (1.702 m)   Wt 166 lb 3.2 oz (75.4 kg)   BMI 26.03 kg/m  , BMI Body mass index is 26.03 kg/m. GEN: Well nourished, well developed, in no acute distress  HEENT: normal  Neck: no JVD, carotid bruits, or masses Cardiac: RRR; no murmurs, rubs, or gallops,no edema  Respiratory:  clear to auscultation bilaterally, normal work of breathing GI: soft, nontender, nondistended, + BS MS: no deformity or atrophy  Skin: warm and dry,  device pocWith 6 x 2 mm open subcuticular area Neuro:  Strength and sensation are intact Psych: euthymic mood, full affect  EKG:  EKG is ordered today. Personal review of the ekg ordered shows AV paced   Device interrogation is reviewed today in detail.  See PaceArt for details.   Recent Labs: 03/17/2016: ALT 34 03/21/2016: Magnesium 2.1 03/22/2016: BUN 20; Potassium 4.0; Sodium 133 03/23/2016: Creatinine, Ser 1.32; Hemoglobin 7.9; Platelets 265    Lipid Panel  No results found for: CHOL, TRIG, HDL, CHOLHDL, VLDL, LDLCALC, LDLDIRECT   Wt Readings from Last 3 Encounters:  06/23/16 166 lb 3.2 oz (75.4 kg)  03/20/16 166 lb 0.1 oz (75.3 kg)  03/09/16 163 lb 1.9 oz (74 kg)      Other studies Reviewed: Additional studies/ records that were reviewed today include: Hospital notes  ASSESSMENT AND PLAN:  1.   third degree AV block: Had left-sided pacemaker explanted due to infection and erosion with reimplant of pacemaker on the right side. Has had issues with wound healing on the right side, but his wound has significantly improved since last seen. We'll continue to wash with antibiotic soap. Device settings  changed for long-term therapy.   2. Atrial fibrillation: on tikosyn but no anticoagulation due to fall risk.  This patients CHA2DS2-VASc Score and unadjusted Ischemic Stroke Rate (% per year) is equal to 4.8 % stroke rate/year from a score of 4  Above score calculated as 1 point each if present [CHF, HTN, DM, Vascular=MI/PAD/Aortic Plaque, Age if 65-74, or Male] Above score calculated as 2 points each if present [Age > 75, or Stroke/TIA/TE]   3. Hypertension: Well controlled today   4. CAD s/p MI: has been having chest pain for the last week. Pain is been improved with nitroglycerin. He is artery on 120 mg  of Imdur. Per his daughter, she does not feel like aggressive measures are necessary. I told her to continue therapy with nitroglycerin.     Current medicines are reviewed at length with the patient today.   The patient  concerns regarding his medicines.  The following changes were made today:  none  Labs/ tests ordered today include:  No orders of the defined types were placed in this encounter.    Disposition:   FU with Kaysan Peixoto 6 months  Signed, Maleigh Bagot Jorja Loa, MD  06/23/2016 3:38 PM     [facility] [address] [place] [address] [phone] (office) [phone] (fax)

## 2016-06-23 ENCOUNTER — Ambulatory Visit (INDEPENDENT_AMBULATORY_CARE_PROVIDER_SITE_OTHER): Payer: Medicare Other | Admitting: Cardiology

## 2016-06-23 ENCOUNTER — Encounter: Payer: Self-pay | Admitting: Cardiology

## 2016-06-23 ENCOUNTER — Encounter: Payer: Medicare Other | Admitting: Cardiology

## 2016-06-23 ENCOUNTER — Encounter (INDEPENDENT_AMBULATORY_CARE_PROVIDER_SITE_OTHER): Payer: Self-pay

## 2016-06-23 VITALS — BP 116/62 | HR 60 | Ht 67.0 in | Wt 166.2 lb

## 2016-06-23 DIAGNOSIS — I4819 Other persistent atrial fibrillation: Secondary | ICD-10-CM

## 2016-06-23 DIAGNOSIS — I495 Sick sinus syndrome: Secondary | ICD-10-CM | POA: Diagnosis not present

## 2016-06-23 DIAGNOSIS — I481 Persistent atrial fibrillation: Secondary | ICD-10-CM | POA: Diagnosis not present

## 2016-06-23 DIAGNOSIS — I480 Paroxysmal atrial fibrillation: Secondary | ICD-10-CM | POA: Diagnosis not present

## 2016-06-23 LAB — CUP PACEART INCLINIC DEVICE CHECK
Date Time Interrogation Session: 20170927040000
Implantable Lead Implant Date: 20170627
Implantable Lead Location: 753859
Implantable Lead Model: 7740
Implantable Lead Model: 7741
Lead Channel Impedance Value: 898 Ohm
Lead Channel Pacing Threshold Amplitude: 1 V
Lead Channel Pacing Threshold Pulse Width: 0.4 ms
Lead Channel Sensing Intrinsic Amplitude: 1.9 mV
Lead Channel Setting Pacing Amplitude: 2 V
Lead Channel Setting Pacing Pulse Width: 0.4 ms
Lead Channel Setting Sensing Sensitivity: 2.5 mV
MDC IDC LEAD IMPLANT DT: 20170627
MDC IDC LEAD LOCATION: 753860
MDC IDC LEAD SERIAL: 674010
MDC IDC LEAD SERIAL: 771101
MDC IDC MSMT LEADCHNL RA IMPEDANCE VALUE: 624 Ohm
MDC IDC MSMT LEADCHNL RA PACING THRESHOLD AMPLITUDE: 0.7 V
MDC IDC MSMT LEADCHNL RA PACING THRESHOLD PULSEWIDTH: 0.4 ms
MDC IDC PG SERIAL: 755133
MDC IDC SET LEADCHNL RV PACING AMPLITUDE: 1.4 V
MDC IDC STAT BRADY RA PERCENT PACED: 97 %
MDC IDC STAT BRADY RV PERCENT PACED: 100 %

## 2016-06-23 NOTE — Patient Instructions (Signed)

## 2016-09-22 ENCOUNTER — Telehealth: Payer: Self-pay | Admitting: Cardiology

## 2016-09-22 ENCOUNTER — Encounter: Payer: Medicare Other | Admitting: *Deleted

## 2016-09-22 NOTE — Telephone Encounter (Signed)
LMOVM reminding pt to send remote transmission.   

## 2016-09-24 ENCOUNTER — Encounter: Payer: Self-pay | Admitting: Cardiology

## 2016-10-15 IMAGING — US US CHEST/MEDIASTINUM
1 series · 14 of 16 positions shown · non-contrast
Comparison: Chest radiograph January 29, 2016

CLINICAL DATA: Hematoma in region of pacemaker, anterior left chest
wall region. Recent fall

EXAM:
ANTERIOR LEFT CHEST WALL REGION ULTRASOUND

[Series 1: us chest/mediastinum · 0.07mm/px · 14 of 16 slices shown]
[im 1/16]
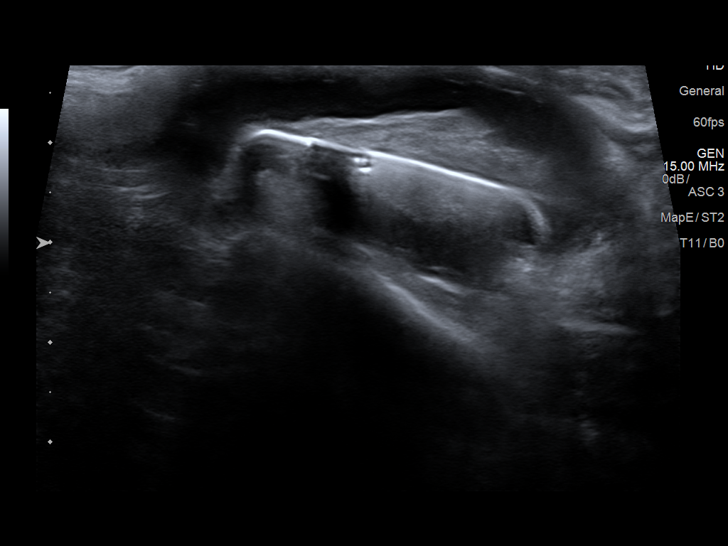
[im 2/16]
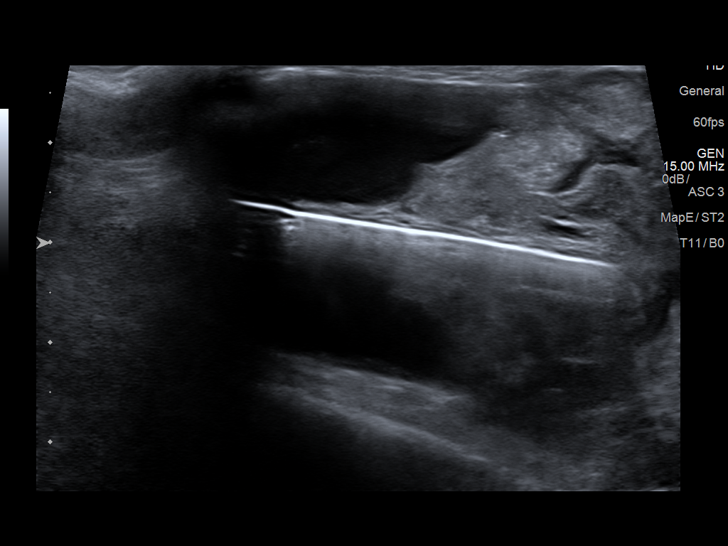
[im 3/16]
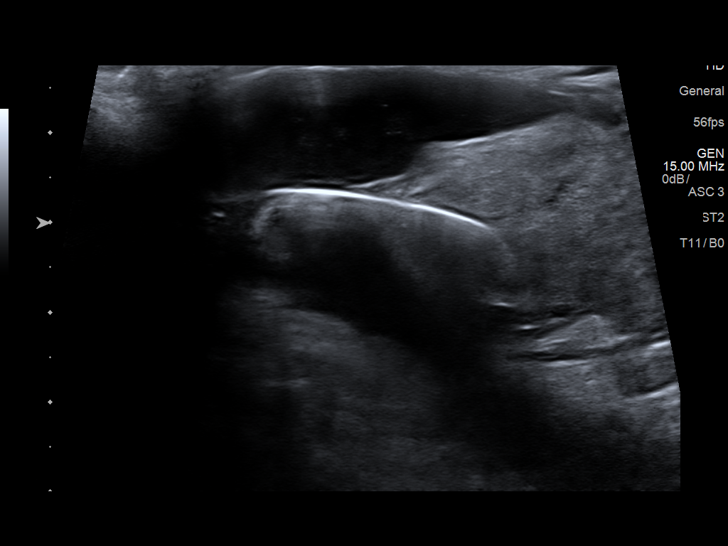
[im 5/16]
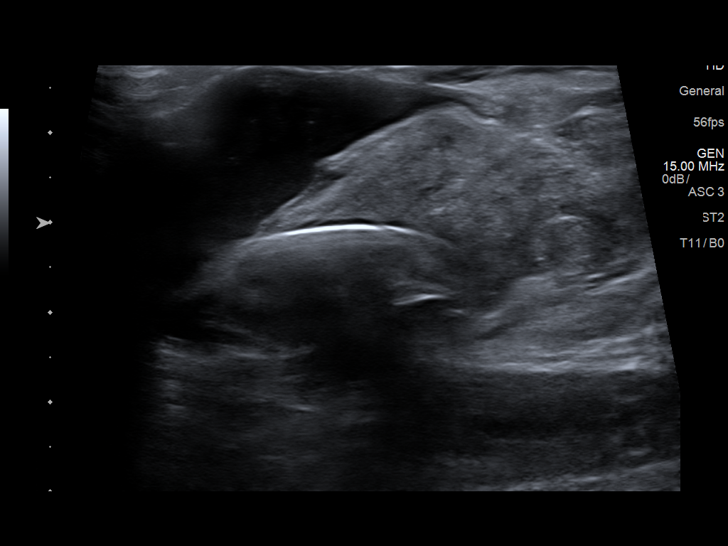
[im 6/16]
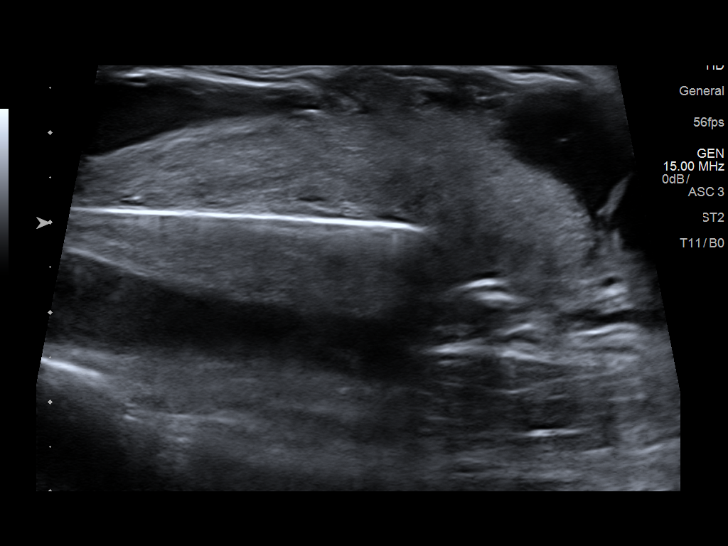
[im 7/16]
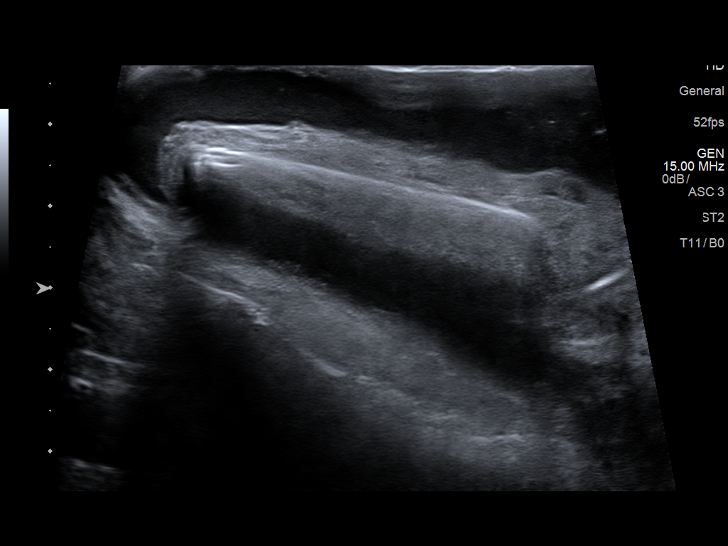
[im 8/16]
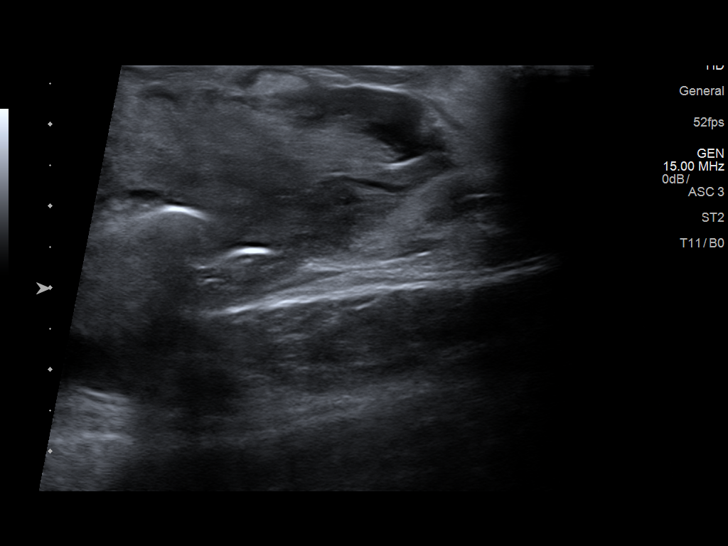
[im 9/16]
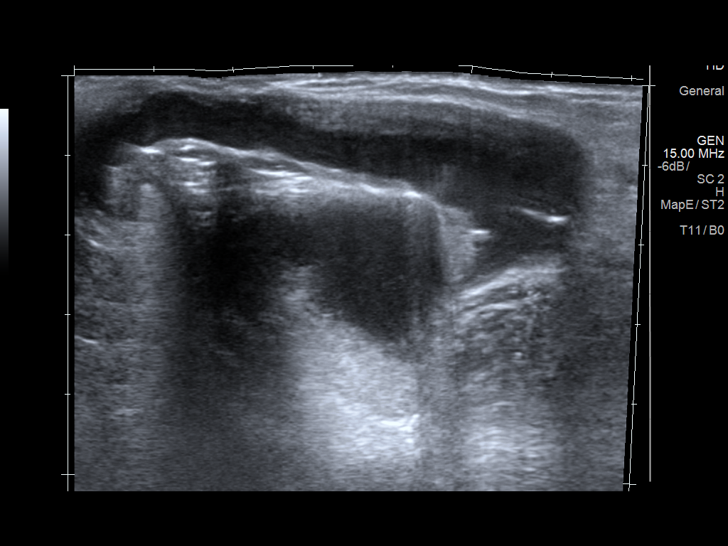
[im 10/16]
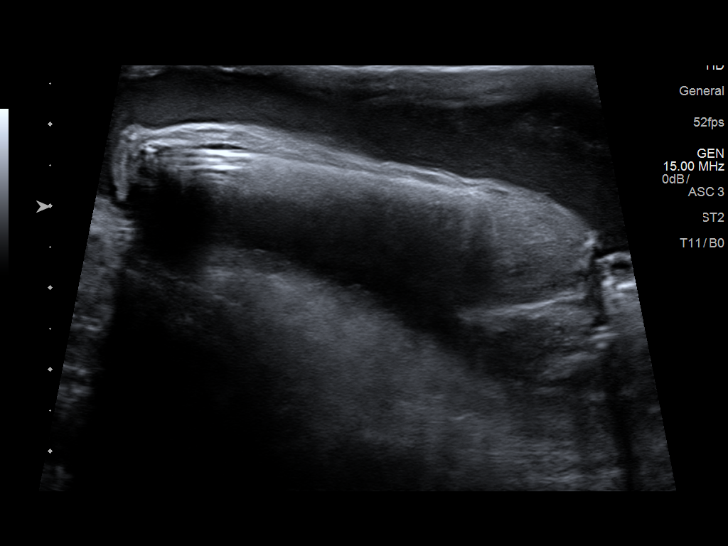
[im 11/16]
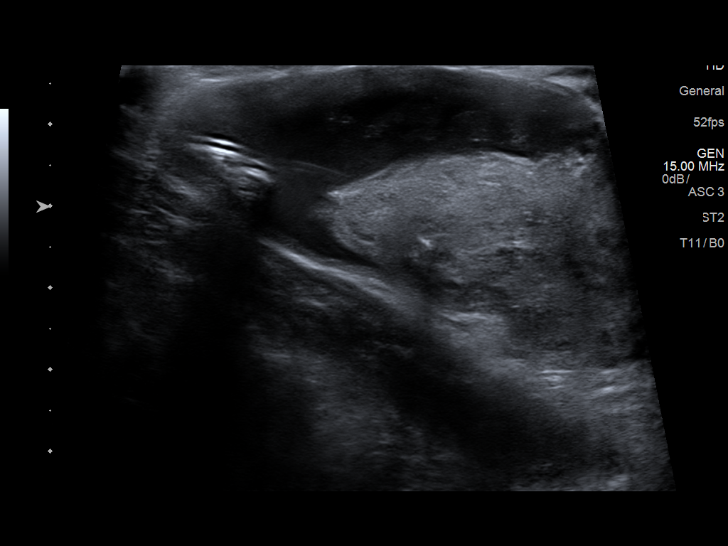
[im 13/16]
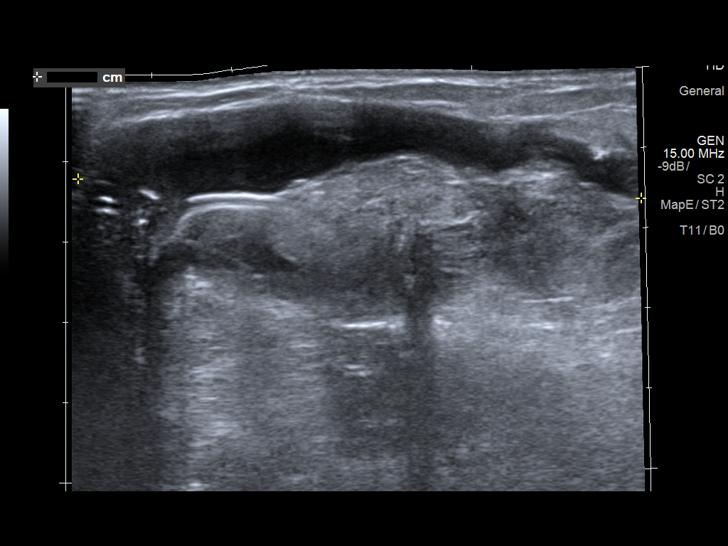
[im 14/16]
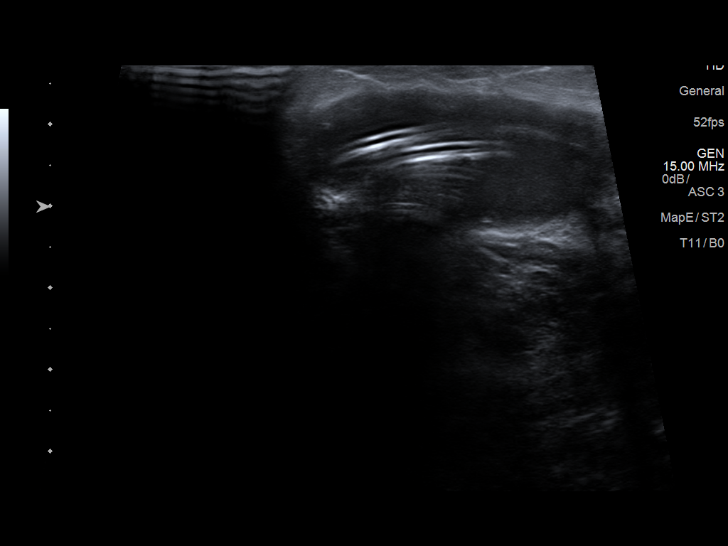
[im 15/16]
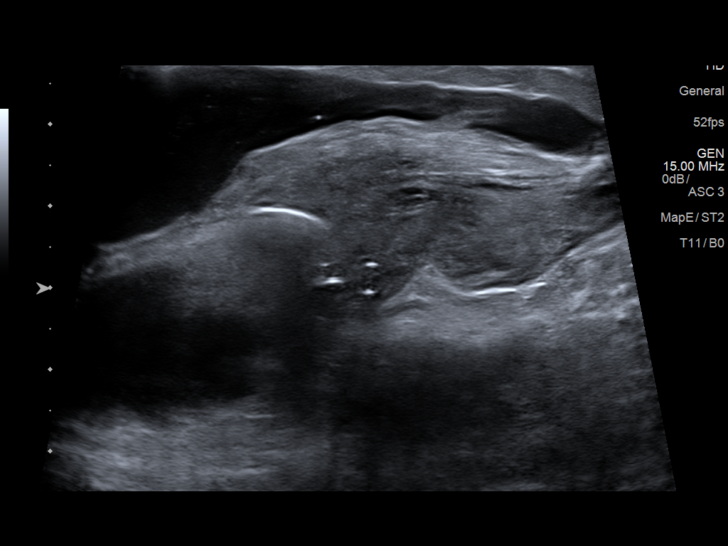
[im 16/16]
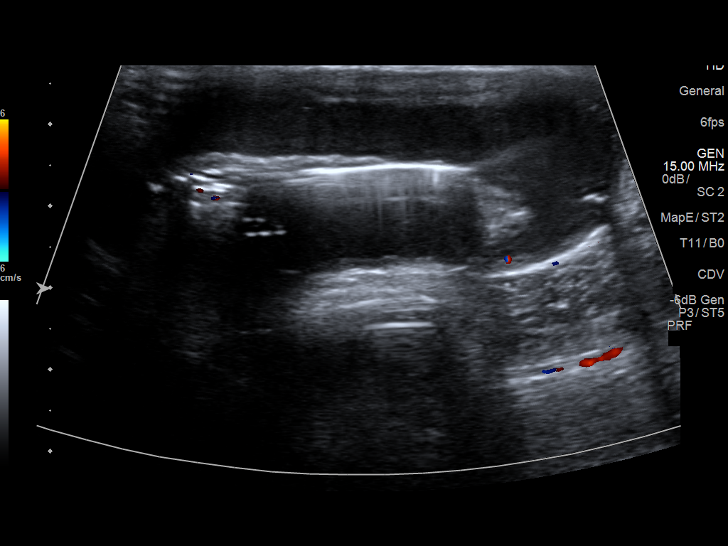

[14 of 16 positions shown; findings below may reference images not displayed]

FINDINGS: Longitudinal and transverse images obtained in the anterior left
chest wall in the region of soft tissue fullness in the pacemaker
insertion site region. There is moderate fluid surrounding the
proximal pacemaker lead on the left anteriorly. There is also soft
tissue thickening in this area, likely representing a degree of
fibrinous tissue as well as surrounding soft tissue muscle. A
well-defined abscess is not seen in this area.
IMPRESSION: Probable liquefying hematoma in the left anterior chest wall region
in the area of palpable fullness. There appears to be potential for
breast tissue also surrounding the proximal pacemaker lead.
Well-defined mass or abscess not seen in this area.

If further evaluation is felt to be warranted, chest CT, ideally
with intravenous contrast, could be helpful to further assess.

## 2016-11-17 ENCOUNTER — Ambulatory Visit (INDEPENDENT_AMBULATORY_CARE_PROVIDER_SITE_OTHER): Payer: Medicare Other | Admitting: *Deleted

## 2016-11-17 DIAGNOSIS — I495 Sick sinus syndrome: Secondary | ICD-10-CM | POA: Diagnosis not present

## 2016-11-17 NOTE — Progress Notes (Signed)
Remote pacemaker transmission.   

## 2016-11-18 ENCOUNTER — Encounter: Payer: Self-pay | Admitting: Cardiology

## 2016-11-18 LAB — CUP PACEART REMOTE DEVICE CHECK
Battery Remaining Longevity: 168 mo
Battery Remaining Percentage: 100 %
Brady Statistic RA Percent Paced: 92 %
Brady Statistic RV Percent Paced: 100 %
Date Time Interrogation Session: 20180221055000
Implantable Lead Implant Date: 20170627
Implantable Lead Implant Date: 20170627
Implantable Lead Location: 753859
Implantable Lead Location: 753860
Implantable Lead Model: 7740
Implantable Lead Model: 7741
Implantable Lead Serial Number: 674010
Implantable Lead Serial Number: 771101
Implantable Pulse Generator Implant Date: 20170627
Lead Channel Impedance Value: 649 Ohm
Lead Channel Impedance Value: 684 Ohm
Lead Channel Pacing Threshold Amplitude: 1 V
Lead Channel Pacing Threshold Pulse Width: 0.4 ms
Lead Channel Setting Pacing Amplitude: 1.5 V
Lead Channel Setting Pacing Amplitude: 2 V
Lead Channel Setting Pacing Pulse Width: 0.4 ms
Lead Channel Setting Sensing Sensitivity: 2.5 mV
Pulse Gen Serial Number: 755133

## 2016-11-25 DIAGNOSIS — I10 Essential (primary) hypertension: Secondary | ICD-10-CM | POA: Diagnosis not present

## 2016-11-25 DIAGNOSIS — I509 Heart failure, unspecified: Secondary | ICD-10-CM | POA: Diagnosis not present

## 2016-11-25 DIAGNOSIS — I480 Paroxysmal atrial fibrillation: Secondary | ICD-10-CM | POA: Diagnosis not present

## 2016-11-25 DIAGNOSIS — F0151 Vascular dementia with behavioral disturbance: Secondary | ICD-10-CM | POA: Diagnosis not present

## 2016-11-25 DIAGNOSIS — K219 Gastro-esophageal reflux disease without esophagitis: Secondary | ICD-10-CM | POA: Diagnosis not present

## 2016-11-29 IMAGING — CT CT CERVICAL SPINE W/O CM
3 of 6 series · 11 of 33 positions shown, 13 images · non-contrast
Comparison: None.

CLINICAL DATA: Pain after fall.

EXAM:
CT HEAD WITHOUT CONTRAST
CT CERVICAL SPINE WITHOUT CONTRAST
TECHNIQUE: Multidetector CT imaging of the head and cervical spine was
performed following the standard protocol without intravenous
contrast. Multiplanar CT image reconstructions of the cervical spine
were also generated.

[Series 305: sagittal · sagittal · 0.35mm/px · 5 of 43 slices shown, 6 images]
[im 15/43  bone]
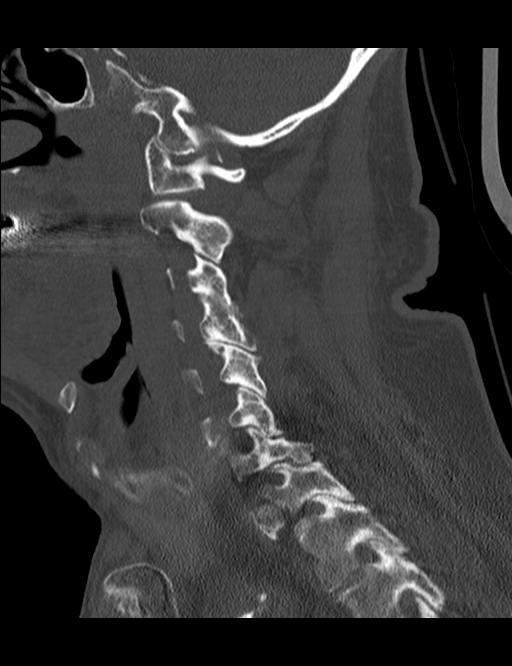
[im 18/43  bone]
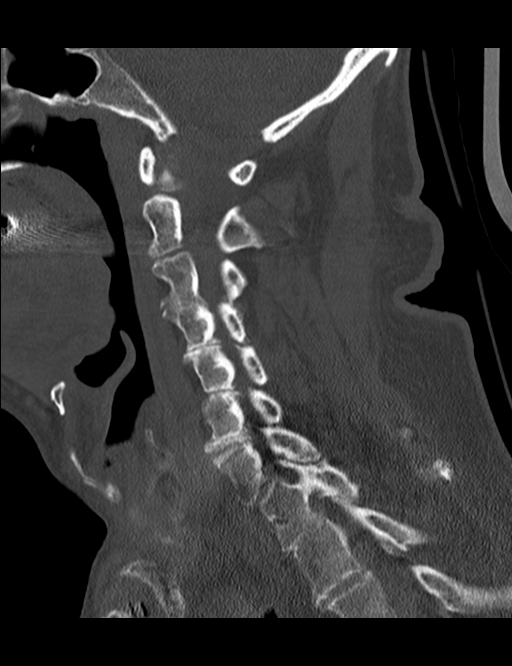
[im 22/43  soft-tissue]
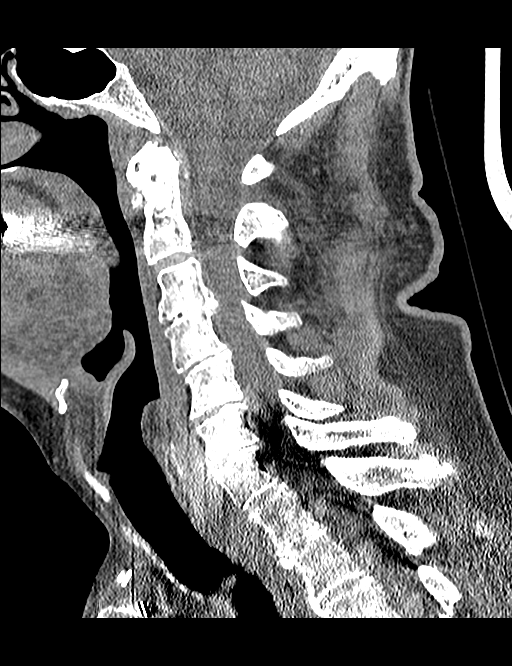
[im 22/43  bone]
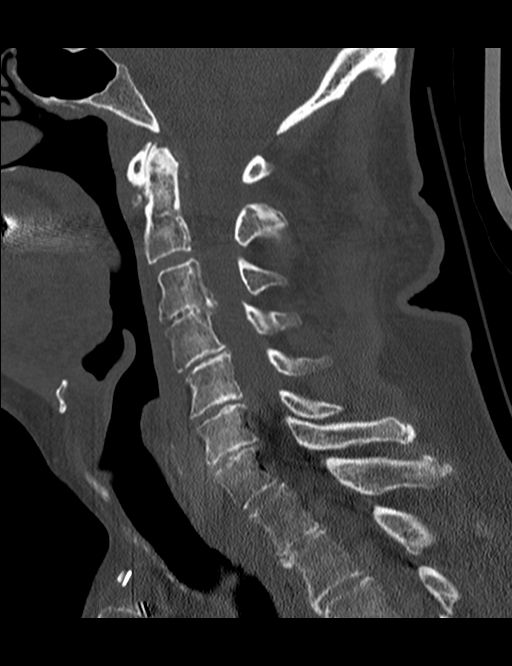
[im 25/43  bone]
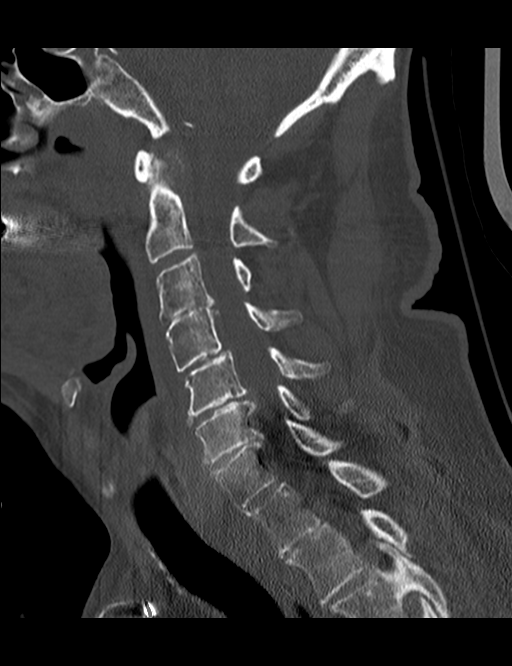
[im 29/43  bone]
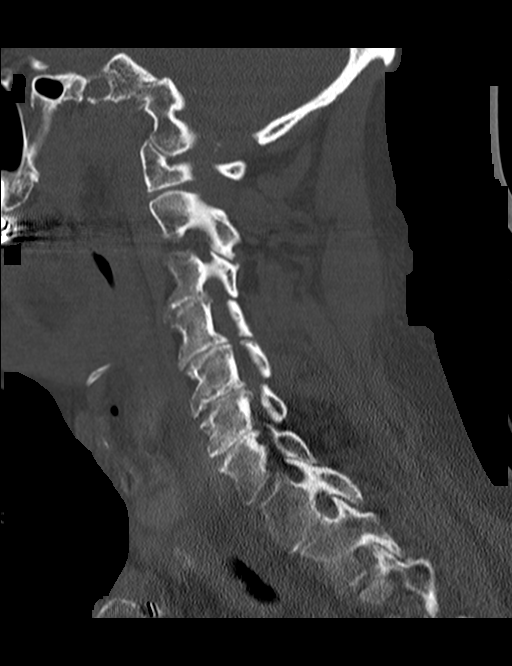

[Series 306: coronal · coronal · 0.35mm/px · 3 of 52 slices shown]
[im 11/52  bone]
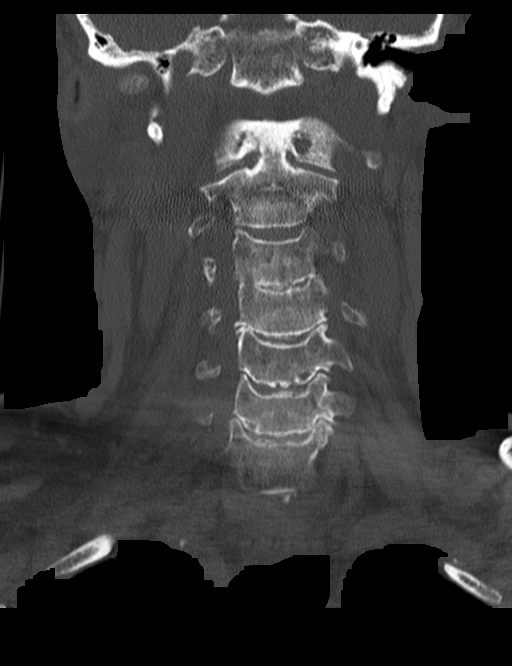
[im 21/52  bone]
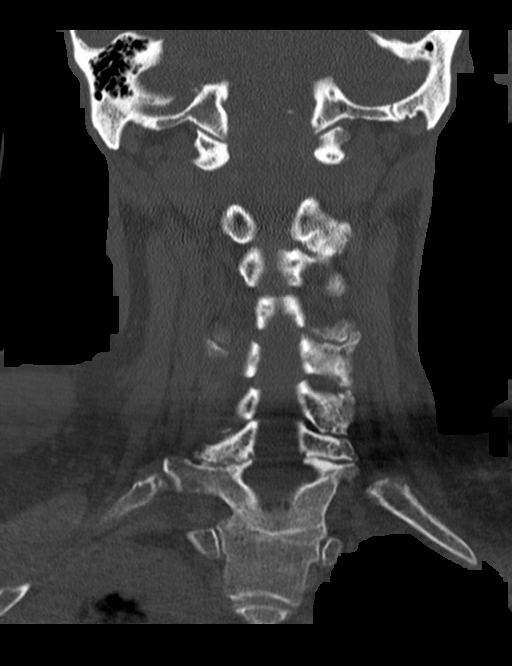
[im 31/52  bone]
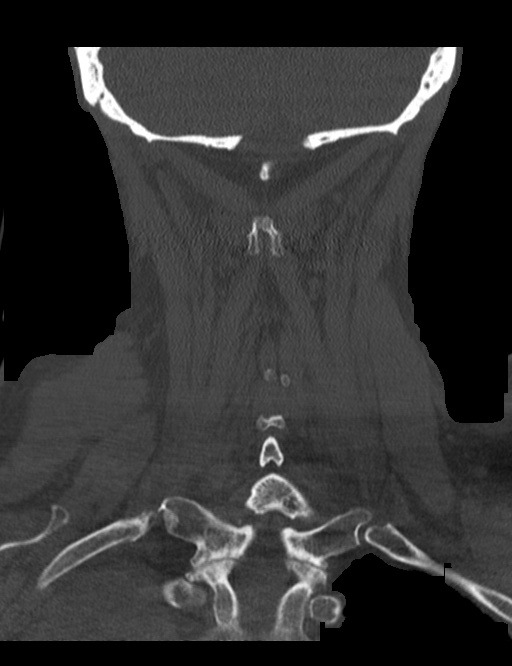

[Series 307: orthogonal · axial · 0.35mm/px · z∈[+141,+237]mm · 3 of 99 slices shown, 4 images]
[im 25/99  soft-tissue]
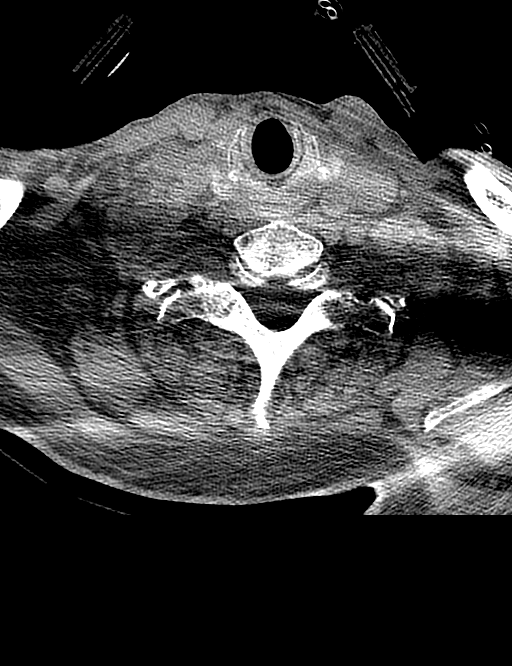
[im 25/99  bone]
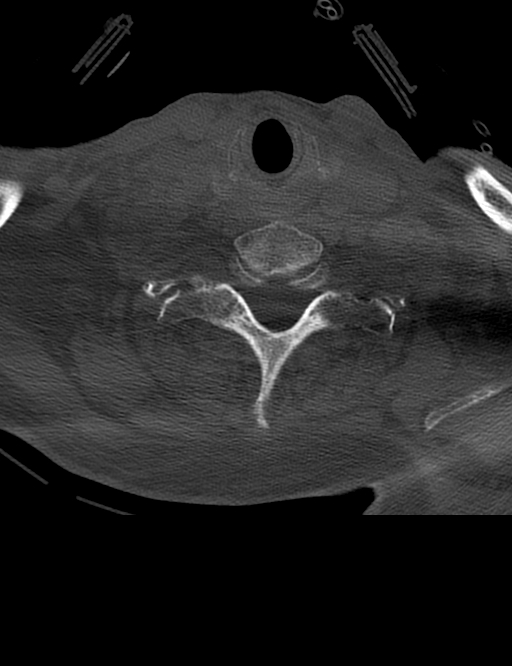
[im 50/99  bone]
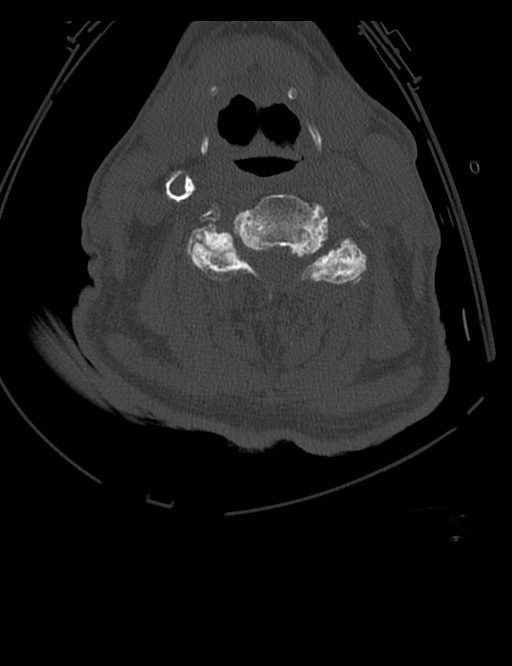
[im 74/99  bone]
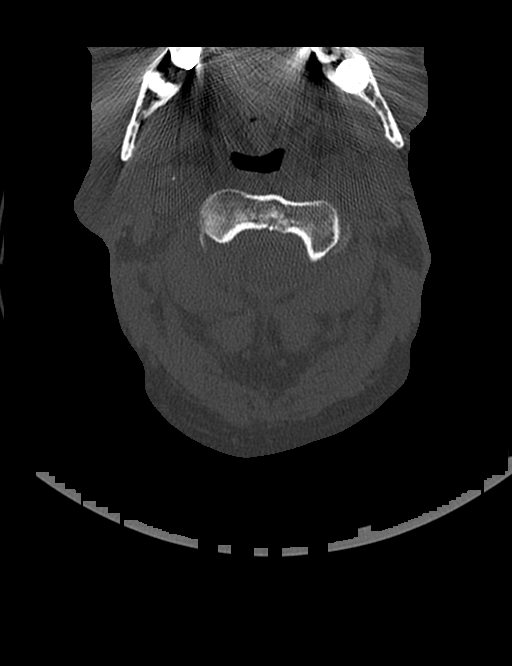

[11 of 33 positions shown; findings below may reference images not displayed]

FINDINGS: CT HEAD FINDINGS

There is mild mucosal thickening in the right maxillary sinus. The
paranasal sinuses and mastoid air cells are otherwise normal. The
bones are intact. Extracranial soft tissues are normal. There is a
tiny sliver of high attenuation over the left parietal region, which
is very linear in configuration, consistent with either beam
hardening artifact or a tiny amount of calcification. This does not
have the appearance of an acute hematoma. No epidural or
subarachnoid hemorrhage identified. No mass, mass effect, or midline
shift. The ventricles and sulci are mildly prominent, likely due to
age related atrophy. Moderate white matter changes are seen. No
acute cortical ischemia or infarct. The cerebellum, brainstem, and
basal cisterns are normal.

CT CERVICAL SPINE FINDINGS

There is a right-sided pleural effusion.

No traumatic malalignment is identified. Minimal anterolisthesis of
C4 versus C5 is likely degenerative and chronic. No fractures are
seen. Multilevel degenerative changes are seen.
IMPRESSION: 1. No acute intracranial abnormalities.
2. Degenerative changes in the cervical spine with no traumatic
malalignment or fracture.
3. Right pleural effusion.

## 2016-11-30 ENCOUNTER — Ambulatory Visit (INDEPENDENT_AMBULATORY_CARE_PROVIDER_SITE_OTHER): Payer: Medicare Other | Admitting: Cardiology

## 2016-11-30 ENCOUNTER — Encounter: Payer: Self-pay | Admitting: Cardiology

## 2016-11-30 ENCOUNTER — Encounter (INDEPENDENT_AMBULATORY_CARE_PROVIDER_SITE_OTHER): Payer: Self-pay

## 2016-11-30 VITALS — BP 116/68 | HR 60 | Ht 67.0 in | Wt 175.3 lb

## 2016-11-30 DIAGNOSIS — I481 Persistent atrial fibrillation: Secondary | ICD-10-CM | POA: Diagnosis not present

## 2016-11-30 DIAGNOSIS — I442 Atrioventricular block, complete: Secondary | ICD-10-CM

## 2016-11-30 DIAGNOSIS — I4819 Other persistent atrial fibrillation: Secondary | ICD-10-CM

## 2016-11-30 LAB — CUP PACEART INCLINIC DEVICE CHECK
Date Time Interrogation Session: 20180306050000
Implantable Lead Implant Date: 20170627
Implantable Lead Location: 753859
Implantable Lead Location: 753860
Implantable Lead Serial Number: 771101
Implantable Pulse Generator Implant Date: 20170627
Lead Channel Pacing Threshold Amplitude: 0.7 V
Lead Channel Pacing Threshold Pulse Width: 0.4 ms
Lead Channel Setting Pacing Pulse Width: 0.4 ms
MDC IDC LEAD IMPLANT DT: 20170627
MDC IDC LEAD SERIAL: 674010
MDC IDC MSMT LEADCHNL RA IMPEDANCE VALUE: 655 Ohm
MDC IDC MSMT LEADCHNL RV IMPEDANCE VALUE: 836 Ohm
MDC IDC MSMT LEADCHNL RV PACING THRESHOLD AMPLITUDE: 1 V
MDC IDC MSMT LEADCHNL RV PACING THRESHOLD PULSEWIDTH: 0.4 ms
MDC IDC SET LEADCHNL RA PACING AMPLITUDE: 2 V
MDC IDC SET LEADCHNL RV PACING AMPLITUDE: 1.6 V
MDC IDC SET LEADCHNL RV SENSING SENSITIVITY: 2.5 mV
MDC IDC STAT BRADY RA PERCENT PACED: 93 %
MDC IDC STAT BRADY RV PERCENT PACED: 100 %
Pulse Gen Serial Number: 755133

## 2016-11-30 NOTE — Progress Notes (Signed)
Electrophysiology Office Note   Date:  11/30/2016   ID:  Gary Leonard, DOB 03/08/1931, MRN [id number]  PCP:  Joycelyn RuaMEYERS, STEPHEN, MD  Primary Electrophysiologist:  Regan LemmingWill Martin Jaylee Freeze, MD    Chief Complaint  Patient presents with  . Pacemaker Check    Complete heart block/PAF     History of Present Illness: Gary KidneyMitchell Leonard is a 83 y.o. male who presents today for electrophysiology evaluation.     Hx CAD (s/p CABG in 1979, 1988, 2002), chronic CHF (dx a few months ago), CVA in 1970s, Afib on Xarelto (for many years), has PPM, vascular dementia dx 01/2015 and HTN. Found to have infected pacemaker after a fall.  Dual chamber pacemaker explanted with reimplant on the right side.  Had issues with wound healing. At that time, he has done well without major complaint. He does continue to have episodic chest pain which is relieved by nitroglycerin. He is currently taking much less mitral glycerin then he was the last time he was in the office. He is occasionally also having some shortness of breath with exertion that does not happen each time he exerts himself.   Today, he denies symptoms of palpitations,  shortness of breath, orthopnea, PND, lower extremity edema, claudication, dizziness, presyncope, syncope, bleeding, or neurologic sequela. The patient is tolerating medications without difficulties and is otherwise without complaint today.   Past Surgical History:  Procedure Laterality Date  . CARDIAC SURGERY     open heart x 3  . CORONARY ARTERY BYPASS GRAFT     1979    1988   . EP IMPLANTABLE DEVICE N/A 03/23/2016   Procedure: Pacemaker Implant;  Surgeon: Marinus MawGregg W Taylor, MD;  [facility];  Service: Cardiovascular;  Laterality: N/A;  . HERNIA REPAIR    . INSERT / REPLACE / REMOVE PACEMAKER    . PACEMAKER LEAD REMOVAL N/A 03/17/2016   Procedure: PACEMAKER LEAD REMOVAL;  Surgeon: Marinus MawGregg W Taylor, MD;  [facility];  Service: Cardiovascular;  Laterality: N/A;  . TEE WITHOUT  CARDIOVERSION N/A 03/17/2016   Procedure: TRANSESOPHAGEAL ECHOCARDIOGRAM (TEE);  Surgeon: Marinus MawGregg W Taylor, MD;  [facility];  Service: Cardiovascular;  Laterality: N/A;  . TOTAL HIP ARTHROPLASTY Left   . TOTAL SHOULDER ARTHROPLASTY Left      Current Outpatient Prescriptions  Medication Sig Dispense Refill  . dofetilide (TIKOSYN) 500 MCG capsule Take 500 mcg by mouth 2 (two) times daily.    Marland Kitchen. donepezil (ARICEPT) 10 MG tablet Take 10 mg by mouth at bedtime.    Marland Kitchen. escitalopram (LEXAPRO) 10 MG tablet Take 10 mg by mouth daily.    . furosemide (LASIX) 40 MG tablet Take 60 mg by mouth daily.     . isosorbide mononitrate (IMDUR) 120 MG 24 hr tablet Take 120 mg by mouth every morning.     . nebivolol (BYSTOLIC) 10 MG tablet Take 10 mg by mouth daily.    . nitroGLYCERIN (NITROSTAT) 0.4 MG SL tablet Place 0.4 mg under the tongue every 5 (five) minutes as needed for chest pain.    . pantoprazole (PROTONIX) 40 MG tablet Take 40 mg by mouth daily.    . potassium chloride (K-DUR) 10 MEQ tablet Take 10 mEq by mouth daily.    . ramipril (ALTACE) 2.5 MG capsule Take 2.5 mg by mouth 2 (two) times daily.    . risperiDONE (RISPERDAL) 0.25 MG tablet Take 0.25 mg by mouth at bedtime.    . rosuvastatin (CRESTOR) 20 MG tablet Take  20 mg by mouth daily.     No current facility-administered medications for this visit.     Allergies:   Patient has no known allergies.   Social History:  The patient  reports that he quit smoking about 44 years ago. His smoking use included Cigarettes. He quit after 40.00 years of use. He has never used smokeless tobacco. He reports that he does not drink alcohol or use drugs.   Family History:  The patient's family history includes Heart disease in his sister.    ROS:  Please see the history of present illness.   Otherwise, review of systems is positive for none.   All other systems are reviewed and negative.    PHYSICAL EXAM: VS:  BP 116/68   Pulse 60   Ht 5\' 7"  (1.702 m)    Wt 175 lb 4.8 oz (79.5 kg)   BMI 27.46 kg/m  , BMI Body mass index is 27.46 kg/m. GEN: Well nourished, well developed, in no acute distress  HEENT: normal  Neck: no JVD, carotid bruits, or masses Cardiac: RRR; no murmurs, rubs, or gallops,no edema  Respiratory:  clear to auscultation bilaterally, normal work of breathing GI: soft, nontender, nondistended, + BS MS: no deformity or atrophy  Skin: warm and dry,  device pocet well healed Neuro:  Strength and sensation are intact Psych: euthymic mood, full affect  EKG:  EKG is ordered today. Personal review of the ekg ordered shows AV paced   Device interrogation is reviewed today in detail.  See PaceArt for details.   Recent Labs: 03/17/2016: ALT 34 03/21/2016: Magnesium 2.1 03/22/2016: BUN 20; Potassium 4.0; Sodium 133 03/23/2016: Creatinine, Ser 1.32; Hemoglobin 7.9; Platelets 265    Lipid Panel  No results found for: CHOL, TRIG, HDL, CHOLHDL, VLDL, LDLCALC, LDLDIRECT   Wt Readings from Last 3 Encounters:  11/30/16 175 lb 4.8 oz (79.5 kg)  06/23/16 166 lb 3.2 oz (75.4 kg)  03/20/16 166 lb 0.1 oz (75.3 kg)      Other studies Reviewed: Additional studies/ records that were reviewed today include: Hospital notes  ASSESSMENT AND PLAN:  1.   Third degree AV block: Had left-sided pacemaker explanted due to infection and erosion with reimplant of pacemaker on the right side. Site is healed well without major issues. Minute ventilation was turned off and fell around her was turned on.  2. Atrial fibrillation: on tikosyn but no anticoagulation due to fall risk and history of GI bleeding that has not been investigated per family wishes..  This patients CHA2DS2-VASc Score and unadjusted Ischemic Stroke Rate (% per year) is equal to 4.8 % stroke rate/year from a score of 4  Above score calculated as 1 point each if present [CHF, HTN, DM, Vascular=MI/PAD/Aortic Plaque, Age if 65-74, or Male] Above score calculated as 2 points  each if present [Age > 75, or Stroke/TIA/TE]  3. Hypertension: Well controlled today   4. CAD s/p MI: Chest pain has improved. We'll continue Imdur and sublingual nitroglycerin    Current medicines are reviewed at length with the patient today.   The patient  concerns regarding his medicines.  The following changes were made today:  none  Labs/ tests ordered today include:  Orders Placed This Encounter  Procedures  . EKG 12-Lead     Disposition:   FU with Sheilyn Boehlke 6 months  Signed, Kaspian Muccio Jorja Loa, MD  11/30/2016 4:17 PM     [facility] [address] [place] [address] (  [phone] (office) [phone] (fax)

## 2016-11-30 NOTE — Patient Instructions (Signed)
Medication Instructions:    Your physician recommends that you continue on your current medications as directed. Please refer to the Current Medication list given to you today.  --- If you need a refill on your cardiac medications before your next appointment, please call your pharmacy. ---  Labwork:  None ordered  Testing/Procedures:  None ordered  Follow-Up: Remote monitoring is used to monitor your Pacemaker of ICD from home. This monitoring reduces the number of office visits required to check your device to one time per year. It allows us to keep an eye on the functioning of your device to ensure it is working properly. You are scheduled for a device check from home on 03/01/2017. You may send your transmission at any time that day. If you have a wireless device, the transmission will be sent automatically. After your physician reviews your transmission, you will receive a postcard with your next transmission date.   Your physician wants you to follow-up in: 6 months with Dr. Elberta Fortisamnitz.  You will receive a reminder letter in the mail two months in advance. If you don't receive a letter, please call our office to schedule the follow-up appointment.   Thank you for choosing [facility]!!   Dory HornSherri Sena Hoopingarner, RN [phone]

## 2017-02-03 ENCOUNTER — Other Ambulatory Visit: Payer: Self-pay

## 2017-02-03 MED ORDER — DOFETILIDE 500 MCG PO CAPS: 500.0000 ug | ORAL_CAPSULE | Freq: Two times a day (BID) | ORAL | 5 refills | Status: AC

## 2017-03-01 ENCOUNTER — Ambulatory Visit (INDEPENDENT_AMBULATORY_CARE_PROVIDER_SITE_OTHER): Payer: Medicare Other | Admitting: *Deleted

## 2017-03-01 DIAGNOSIS — I442 Atrioventricular block, complete: Secondary | ICD-10-CM | POA: Diagnosis not present

## 2017-03-01 NOTE — Progress Notes (Signed)
Remote pacemaker transmission.   

## 2017-03-08 ENCOUNTER — Encounter: Payer: Self-pay | Admitting: Cardiology

## 2017-03-10 LAB — CUP PACEART REMOTE DEVICE CHECK
Battery Remaining Longevity: 174 mo
Battery Remaining Percentage: 100 %
Date Time Interrogation Session: 20180605040000
Implantable Lead Implant Date: 20170627
Implantable Lead Implant Date: 20170627
Implantable Lead Location: 753860
Implantable Lead Model: 7740
Implantable Lead Serial Number: 674010
Implantable Pulse Generator Implant Date: 20170627
Lead Channel Pacing Threshold Amplitude: 1 V
Lead Channel Pacing Threshold Pulse Width: 0.4 ms
Lead Channel Setting Pacing Pulse Width: 0.4 ms
Lead Channel Setting Sensing Sensitivity: 2.5 mV
MDC IDC LEAD LOCATION: 753859
MDC IDC LEAD SERIAL: 771101
MDC IDC MSMT LEADCHNL RA IMPEDANCE VALUE: 663 Ohm
MDC IDC MSMT LEADCHNL RV IMPEDANCE VALUE: 602 Ohm
MDC IDC PG SERIAL: 755133
MDC IDC SET LEADCHNL RA PACING AMPLITUDE: 2 V
MDC IDC SET LEADCHNL RV PACING AMPLITUDE: 1.5 V
MDC IDC STAT BRADY RA PERCENT PACED: 95 %
MDC IDC STAT BRADY RV PERCENT PACED: 100 %

## 2017-04-05 ENCOUNTER — Telehealth: Payer: Self-pay | Admitting: Cardiology

## 2017-04-05 NOTE — Telephone Encounter (Signed)
Spoke with patient's daughter who reports that they are going to establish care with a clinic in [place] on 8/9. I explained that the new clinic could request the home monitoring within the system and could then take over with his remote monitoring schedule. She verbalized understanding.

## 2017-04-05 NOTE — Telephone Encounter (Signed)
Marcelino DusterMichelle (Pt daughter) calling, states that her father physically cannot make drive back [place] to [place]. Marcelino DusterMichelle would like to switch patient back to cardiologist in [place] and would like to know how to go about having patient's pacemaker monitored?

## 2017-04-22 DIAGNOSIS — I10 Essential (primary) hypertension: Secondary | ICD-10-CM | POA: Diagnosis not present

## 2017-04-22 DIAGNOSIS — E1165 Type 2 diabetes mellitus with hyperglycemia: Secondary | ICD-10-CM | POA: Diagnosis not present

## 2017-04-22 DIAGNOSIS — E559 Vitamin D deficiency, unspecified: Secondary | ICD-10-CM | POA: Diagnosis not present

## 2017-04-22 DIAGNOSIS — F349 Persistent mood [affective] disorder, unspecified: Secondary | ICD-10-CM | POA: Diagnosis not present

## 2017-04-22 DIAGNOSIS — E785 Hyperlipidemia, unspecified: Secondary | ICD-10-CM | POA: Diagnosis not present

## 2017-04-22 DIAGNOSIS — M1990 Unspecified osteoarthritis, unspecified site: Secondary | ICD-10-CM | POA: Diagnosis not present

## 2017-04-22 DIAGNOSIS — F0390 Unspecified dementia without behavioral disturbance: Secondary | ICD-10-CM | POA: Diagnosis not present

## 2017-04-22 DIAGNOSIS — I482 Chronic atrial fibrillation: Secondary | ICD-10-CM | POA: Diagnosis not present

## 2017-04-22 DIAGNOSIS — I2510 Atherosclerotic heart disease of native coronary artery without angina pectoris: Secondary | ICD-10-CM | POA: Diagnosis not present

## 2017-05-05 DIAGNOSIS — Z950 Presence of cardiac pacemaker: Secondary | ICD-10-CM | POA: Diagnosis not present

## 2017-05-05 DIAGNOSIS — I255 Ischemic cardiomyopathy: Secondary | ICD-10-CM | POA: Diagnosis not present

## 2017-05-05 DIAGNOSIS — K219 Gastro-esophageal reflux disease without esophagitis: Secondary | ICD-10-CM | POA: Diagnosis not present

## 2017-05-05 DIAGNOSIS — I2510 Atherosclerotic heart disease of native coronary artery without angina pectoris: Secondary | ICD-10-CM | POA: Diagnosis not present

## 2017-05-05 DIAGNOSIS — E785 Hyperlipidemia, unspecified: Secondary | ICD-10-CM | POA: Diagnosis not present

## 2017-05-05 DIAGNOSIS — I495 Sick sinus syndrome: Secondary | ICD-10-CM | POA: Diagnosis not present

## 2017-05-05 DIAGNOSIS — I10 Essential (primary) hypertension: Secondary | ICD-10-CM | POA: Diagnosis not present

## 2017-05-18 DIAGNOSIS — H2513 Age-related nuclear cataract, bilateral: Secondary | ICD-10-CM | POA: Diagnosis not present

## 2017-05-31 ENCOUNTER — Ambulatory Visit (INDEPENDENT_AMBULATORY_CARE_PROVIDER_SITE_OTHER): Payer: Medicare Other | Admitting: *Deleted

## 2017-05-31 DIAGNOSIS — I442 Atrioventricular block, complete: Secondary | ICD-10-CM

## 2017-06-01 NOTE — Progress Notes (Signed)
Remote pacemaker transmission.   

## 2017-06-08 ENCOUNTER — Encounter: Payer: Self-pay | Admitting: Cardiology

## 2017-06-10 LAB — CUP PACEART REMOTE DEVICE CHECK
Battery Remaining Longevity: 174 mo
Brady Statistic RA Percent Paced: 96 %
Brady Statistic RV Percent Paced: 100 %
Date Time Interrogation Session: 20180904040100
Implantable Lead Implant Date: 20170627
Implantable Lead Location: 753860
Implantable Lead Model: 7740
Implantable Lead Serial Number: 674010
Lead Channel Setting Pacing Amplitude: 2 V
Lead Channel Setting Pacing Pulse Width: 0.4 ms
Lead Channel Setting Sensing Sensitivity: 2.5 mV
MDC IDC LEAD IMPLANT DT: 20170627
MDC IDC LEAD LOCATION: 753859
MDC IDC LEAD SERIAL: 771101
MDC IDC MSMT BATTERY REMAINING PERCENTAGE: 100 %
MDC IDC MSMT LEADCHNL RA IMPEDANCE VALUE: 660 Ohm
MDC IDC MSMT LEADCHNL RV IMPEDANCE VALUE: 584 Ohm
MDC IDC MSMT LEADCHNL RV PACING THRESHOLD AMPLITUDE: 0.9 V
MDC IDC MSMT LEADCHNL RV PACING THRESHOLD PULSEWIDTH: 0.4 ms
MDC IDC PG IMPLANT DT: 20170627
MDC IDC PG SERIAL: 755133
MDC IDC SET LEADCHNL RV PACING AMPLITUDE: 1.5 V

## 2017-06-14 DIAGNOSIS — I495 Sick sinus syndrome: Secondary | ICD-10-CM | POA: Diagnosis not present

## 2017-07-19 DIAGNOSIS — Z23 Encounter for immunization: Secondary | ICD-10-CM | POA: Diagnosis not present

## 2017-08-23 DIAGNOSIS — Z0000 Encounter for general adult medical examination without abnormal findings: Secondary | ICD-10-CM | POA: Diagnosis not present

## 2017-08-23 DIAGNOSIS — E785 Hyperlipidemia, unspecified: Secondary | ICD-10-CM | POA: Diagnosis not present

## 2017-08-23 DIAGNOSIS — F0390 Unspecified dementia without behavioral disturbance: Secondary | ICD-10-CM | POA: Diagnosis not present

## 2017-08-23 DIAGNOSIS — E1165 Type 2 diabetes mellitus with hyperglycemia: Secondary | ICD-10-CM | POA: Diagnosis not present

## 2017-08-23 DIAGNOSIS — I482 Chronic atrial fibrillation: Secondary | ICD-10-CM | POA: Diagnosis not present

## 2017-08-23 DIAGNOSIS — I2510 Atherosclerotic heart disease of native coronary artery without angina pectoris: Secondary | ICD-10-CM | POA: Diagnosis not present

## 2017-08-23 DIAGNOSIS — D649 Anemia, unspecified: Secondary | ICD-10-CM | POA: Diagnosis not present

## 2017-08-30 ENCOUNTER — Telehealth: Payer: Self-pay | Admitting: Cardiology

## 2017-08-30 ENCOUNTER — Encounter: Payer: Medicare Other | Admitting: *Deleted

## 2017-08-30 NOTE — Telephone Encounter (Signed)
Confirmed remote transmission w/ pt daughter.   

## 2017-09-22 DIAGNOSIS — I495 Sick sinus syndrome: Secondary | ICD-10-CM | POA: Diagnosis not present

## 2017-12-14 DIAGNOSIS — M1990 Unspecified osteoarthritis, unspecified site: Secondary | ICD-10-CM | POA: Diagnosis not present

## 2017-12-14 DIAGNOSIS — K219 Gastro-esophageal reflux disease without esophagitis: Secondary | ICD-10-CM | POA: Diagnosis not present

## 2017-12-14 DIAGNOSIS — I482 Chronic atrial fibrillation: Secondary | ICD-10-CM | POA: Diagnosis not present

## 2017-12-14 DIAGNOSIS — E1165 Type 2 diabetes mellitus with hyperglycemia: Secondary | ICD-10-CM | POA: Diagnosis not present

## 2017-12-14 DIAGNOSIS — I2510 Atherosclerotic heart disease of native coronary artery without angina pectoris: Secondary | ICD-10-CM | POA: Diagnosis not present

## 2017-12-15 DIAGNOSIS — E1165 Type 2 diabetes mellitus with hyperglycemia: Secondary | ICD-10-CM | POA: Diagnosis not present

## 2017-12-15 DIAGNOSIS — E7849 Other hyperlipidemia: Secondary | ICD-10-CM | POA: Diagnosis not present

## 2017-12-15 DIAGNOSIS — F0150 Vascular dementia without behavioral disturbance: Secondary | ICD-10-CM | POA: Diagnosis not present

## 2017-12-15 DIAGNOSIS — D6489 Other specified anemias: Secondary | ICD-10-CM | POA: Diagnosis not present

## 2018-01-12 DIAGNOSIS — I480 Paroxysmal atrial fibrillation: Secondary | ICD-10-CM | POA: Diagnosis not present

## 2018-01-12 DIAGNOSIS — Z950 Presence of cardiac pacemaker: Secondary | ICD-10-CM | POA: Diagnosis not present

## 2018-01-12 DIAGNOSIS — I255 Ischemic cardiomyopathy: Secondary | ICD-10-CM | POA: Diagnosis not present

## 2018-01-12 DIAGNOSIS — H109 Unspecified conjunctivitis: Secondary | ICD-10-CM | POA: Diagnosis not present

## 2018-01-12 DIAGNOSIS — I2510 Atherosclerotic heart disease of native coronary artery without angina pectoris: Secondary | ICD-10-CM | POA: Diagnosis not present

## 2018-02-19 DIAGNOSIS — L723 Sebaceous cyst: Secondary | ICD-10-CM | POA: Diagnosis not present

## 2018-03-20 DIAGNOSIS — I495 Sick sinus syndrome: Secondary | ICD-10-CM | POA: Diagnosis not present

## 2018-04-07 ENCOUNTER — Encounter: Payer: Self-pay | Admitting: Cardiology

## 2018-04-07 DIAGNOSIS — Z951 Presence of aortocoronary bypass graft: Secondary | ICD-10-CM | POA: Diagnosis not present

## 2018-04-07 DIAGNOSIS — M542 Cervicalgia: Secondary | ICD-10-CM | POA: Diagnosis not present

## 2018-04-07 DIAGNOSIS — S0101XA Laceration without foreign body of scalp, initial encounter: Secondary | ICD-10-CM | POA: Diagnosis not present

## 2018-04-07 DIAGNOSIS — Z66 Do not resuscitate: Secondary | ICD-10-CM | POA: Diagnosis present

## 2018-04-07 DIAGNOSIS — Z888 Allergy status to other drugs, medicaments and biological substances status: Secondary | ICD-10-CM | POA: Diagnosis not present

## 2018-04-07 DIAGNOSIS — S0990XA Unspecified injury of head, initial encounter: Secondary | ICD-10-CM | POA: Diagnosis not present

## 2018-04-07 DIAGNOSIS — Z515 Encounter for palliative care: Secondary | ICD-10-CM | POA: Diagnosis present

## 2018-04-07 DIAGNOSIS — I255 Ischemic cardiomyopathy: Secondary | ICD-10-CM | POA: Diagnosis not present

## 2018-04-07 DIAGNOSIS — I25810 Atherosclerosis of coronary artery bypass graft(s) without angina pectoris: Secondary | ICD-10-CM | POA: Diagnosis not present

## 2018-04-07 DIAGNOSIS — I639 Cerebral infarction, unspecified: Secondary | ICD-10-CM | POA: Diagnosis not present

## 2018-04-07 DIAGNOSIS — R740 Nonspecific elevation of levels of transaminase and lactic acid dehydrogenase [LDH]: Secondary | ICD-10-CM | POA: Diagnosis present

## 2018-04-07 DIAGNOSIS — Z043 Encounter for examination and observation following other accident: Secondary | ICD-10-CM | POA: Diagnosis not present

## 2018-04-07 DIAGNOSIS — F05 Delirium due to known physiological condition: Secondary | ICD-10-CM | POA: Diagnosis not present

## 2018-04-07 DIAGNOSIS — W19XXXA Unspecified fall, initial encounter: Secondary | ICD-10-CM | POA: Diagnosis not present

## 2018-04-07 DIAGNOSIS — K5909 Other constipation: Secondary | ICD-10-CM | POA: Diagnosis present

## 2018-04-07 DIAGNOSIS — I2510 Atherosclerotic heart disease of native coronary artery without angina pectoris: Secondary | ICD-10-CM | POA: Diagnosis not present

## 2018-04-07 DIAGNOSIS — R42 Dizziness and giddiness: Secondary | ICD-10-CM | POA: Diagnosis not present

## 2018-04-07 DIAGNOSIS — Z950 Presence of cardiac pacemaker: Secondary | ICD-10-CM | POA: Diagnosis not present

## 2018-04-07 DIAGNOSIS — I495 Sick sinus syndrome: Secondary | ICD-10-CM | POA: Diagnosis not present

## 2018-04-07 DIAGNOSIS — Z96641 Presence of right artificial hip joint: Secondary | ICD-10-CM | POA: Diagnosis not present

## 2018-04-07 DIAGNOSIS — I480 Paroxysmal atrial fibrillation: Secondary | ICD-10-CM | POA: Diagnosis not present

## 2018-04-07 DIAGNOSIS — I517 Cardiomegaly: Secondary | ICD-10-CM | POA: Diagnosis not present

## 2018-04-07 DIAGNOSIS — Z8673 Personal history of transient ischemic attack (TIA), and cerebral infarction without residual deficits: Secondary | ICD-10-CM | POA: Diagnosis not present

## 2018-04-07 DIAGNOSIS — S098XXA Other specified injuries of head, initial encounter: Secondary | ICD-10-CM | POA: Diagnosis not present

## 2018-04-07 DIAGNOSIS — S199XXA Unspecified injury of neck, initial encounter: Secondary | ICD-10-CM | POA: Diagnosis not present

## 2018-04-07 DIAGNOSIS — N189 Chronic kidney disease, unspecified: Secondary | ICD-10-CM | POA: Diagnosis not present

## 2018-04-07 DIAGNOSIS — F0390 Unspecified dementia without behavioral disturbance: Secondary | ICD-10-CM | POA: Diagnosis not present

## 2018-04-07 DIAGNOSIS — K219 Gastro-esophageal reflux disease without esophagitis: Secondary | ICD-10-CM | POA: Diagnosis not present

## 2018-04-07 DIAGNOSIS — Z79899 Other long term (current) drug therapy: Secondary | ICD-10-CM | POA: Diagnosis not present

## 2018-04-07 DIAGNOSIS — G309 Alzheimer's disease, unspecified: Secondary | ICD-10-CM | POA: Diagnosis not present

## 2018-04-07 DIAGNOSIS — I420 Dilated cardiomyopathy: Secondary | ICD-10-CM | POA: Diagnosis not present

## 2018-04-07 DIAGNOSIS — F0280 Dementia in other diseases classified elsewhere without behavioral disturbance: Secondary | ICD-10-CM | POA: Diagnosis not present

## 2018-04-07 DIAGNOSIS — I63441 Cerebral infarction due to embolism of right cerebellar artery: Secondary | ICD-10-CM | POA: Diagnosis not present

## 2018-04-07 DIAGNOSIS — E119 Type 2 diabetes mellitus without complications: Secondary | ICD-10-CM | POA: Diagnosis present

## 2018-04-07 DIAGNOSIS — Z87891 Personal history of nicotine dependence: Secondary | ICD-10-CM | POA: Diagnosis not present

## 2018-04-07 DIAGNOSIS — J90 Pleural effusion, not elsewhere classified: Secondary | ICD-10-CM | POA: Diagnosis not present

## 2018-04-07 DIAGNOSIS — R296 Repeated falls: Secondary | ICD-10-CM | POA: Diagnosis not present

## 2018-04-08 DIAGNOSIS — W19XXXA Unspecified fall, initial encounter: Secondary | ICD-10-CM | POA: Diagnosis not present

## 2018-04-08 DIAGNOSIS — Z043 Encounter for examination and observation following other accident: Secondary | ICD-10-CM | POA: Diagnosis not present

## 2018-04-08 DIAGNOSIS — Z96641 Presence of right artificial hip joint: Secondary | ICD-10-CM | POA: Diagnosis not present

## 2018-04-09 DIAGNOSIS — J90 Pleural effusion, not elsewhere classified: Secondary | ICD-10-CM | POA: Diagnosis not present

## 2018-04-09 DIAGNOSIS — I517 Cardiomegaly: Secondary | ICD-10-CM | POA: Diagnosis not present

## 2018-04-20 ENCOUNTER — Telehealth: Payer: Self-pay | Admitting: Cardiology

## 2018-04-20 NOTE — Telephone Encounter (Signed)
Noted, no remote appointments since 2018, recall deleted.

## 2018-04-20 NOTE — Telephone Encounter (Signed)
New message  Pt daughter her father is seeing another cardiologist no longer coming [facility].

## 2018-06-27 DEATH — deceased
